# Patient Record
Sex: Male | Born: 1970 | Race: White | Hispanic: No | Marital: Single | Smoking: Current every day smoker
Health system: Southern US, Community
[De-identification: ages and names within clinical notes are randomized; demographics above are authoritative.]

## PROBLEM LIST (undated history)

## (undated) DIAGNOSIS — M1990 Unspecified osteoarthritis, unspecified site: Secondary | ICD-10-CM

## (undated) DIAGNOSIS — E785 Hyperlipidemia, unspecified: Secondary | ICD-10-CM

## (undated) DIAGNOSIS — F32A Depression, unspecified: Secondary | ICD-10-CM

## (undated) DIAGNOSIS — T7840XA Allergy, unspecified, initial encounter: Secondary | ICD-10-CM

## (undated) DIAGNOSIS — F329 Major depressive disorder, single episode, unspecified: Secondary | ICD-10-CM

## (undated) DIAGNOSIS — I10 Essential (primary) hypertension: Secondary | ICD-10-CM

## (undated) DIAGNOSIS — F419 Anxiety disorder, unspecified: Secondary | ICD-10-CM

## (undated) DIAGNOSIS — R569 Unspecified convulsions: Secondary | ICD-10-CM

## (undated) HISTORY — PX: KNEE SURGERY: SHX244

## (undated) HISTORY — PX: VASECTOMY: SHX75

## (undated) HISTORY — DX: Unspecified osteoarthritis, unspecified site: M19.90

## (undated) HISTORY — DX: Essential (primary) hypertension: I10

## (undated) HISTORY — DX: Major depressive disorder, single episode, unspecified: F32.9

## (undated) HISTORY — DX: Anxiety disorder, unspecified: F41.9

## (undated) HISTORY — PX: APPENDECTOMY: SHX54

## (undated) HISTORY — DX: Unspecified convulsions: R56.9

## (undated) HISTORY — DX: Allergy, unspecified, initial encounter: T78.40XA

## (undated) HISTORY — PX: COLONOSCOPY: SHX174

## (undated) HISTORY — DX: Hyperlipidemia, unspecified: E78.5

## (undated) HISTORY — DX: Depression, unspecified: F32.A

---

## 2010-07-16 ENCOUNTER — Ambulatory Visit (HOSPITAL_COMMUNITY): Admission: RE | Admit: 2010-07-16 | Discharge: 2010-07-16 | Payer: Self-pay | Admitting: Orthopedic Surgery

## 2012-03-24 IMAGING — CR DG ORBITS FOR FOREIGN BODY
2 series · 2 of 2 positions shown · non-contrast
Comparison: none

*RADIOLOGY  REPORT*
CLINICAL DATA: Metal working/exposure;  clearance prior to MRI

ORBITS FOR FOREIGN BODY - 2 VIEW

[w waters (1 of 2)]
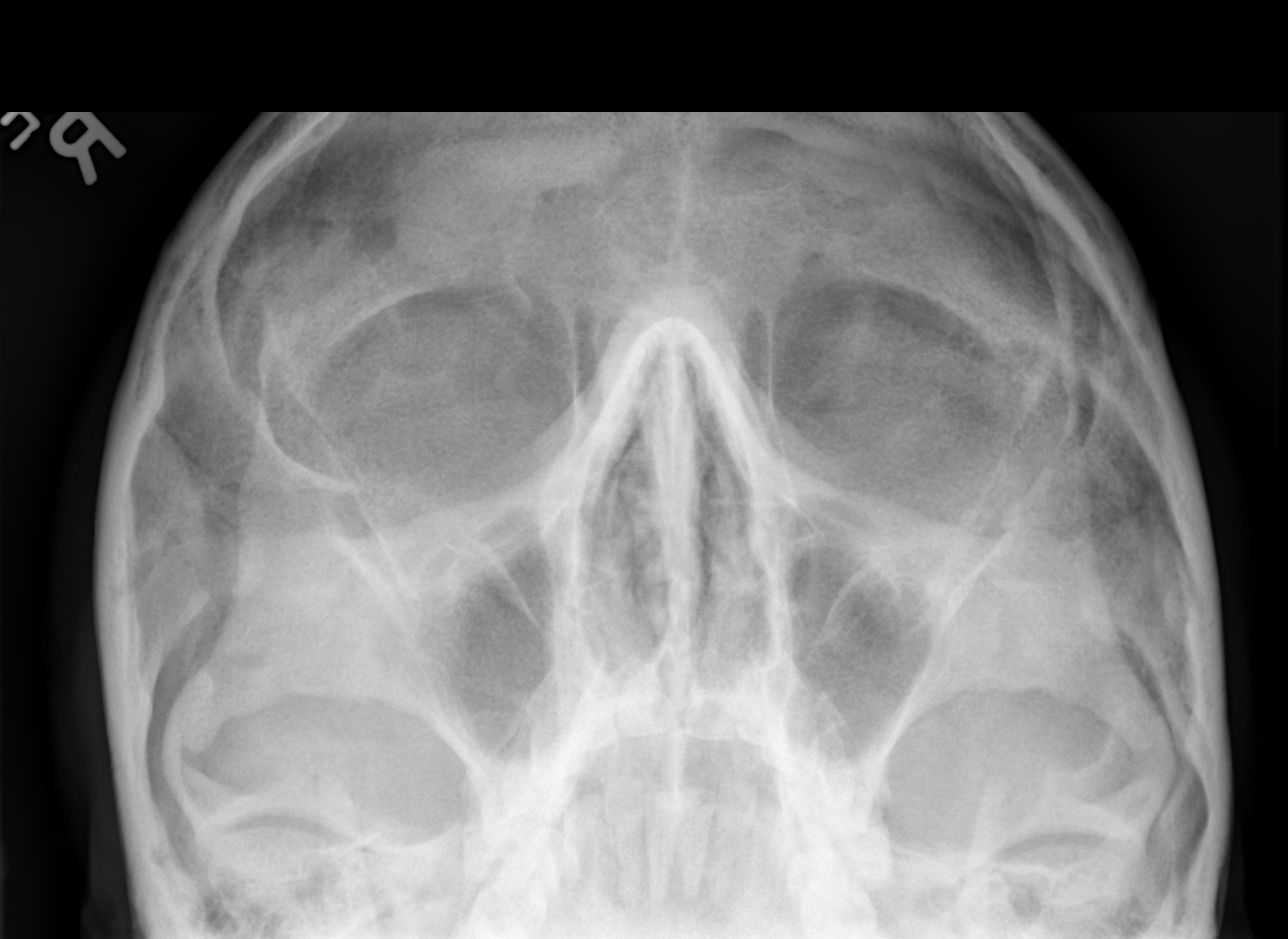

[w waters (2 of 2)]
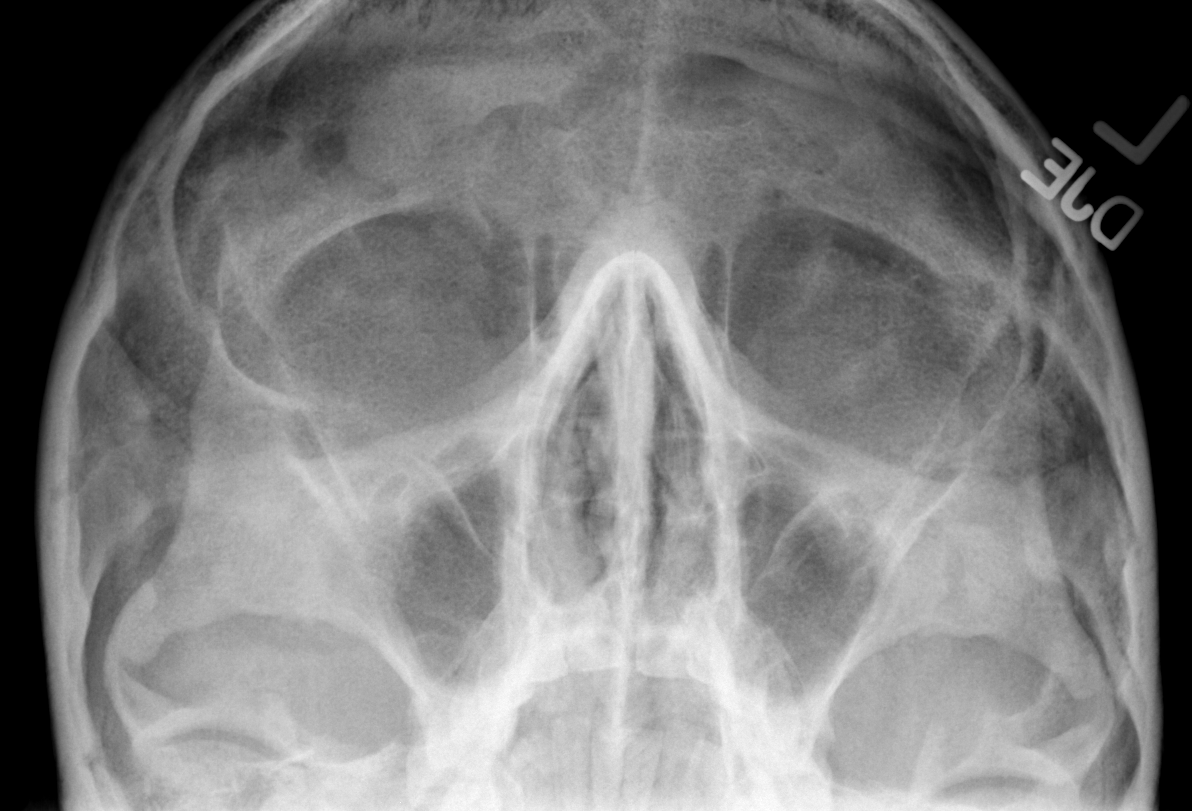

[2 of 2 positions shown; findings below may reference images not displayed]

FINDINGS: There is no evidence of metallic foreign body within the
orbits.  No significant bone abnormality identified.

IMPRESSION
No evidence of metallic foreign body within the orbits.

## 2015-09-06 ENCOUNTER — Ambulatory Visit (INDEPENDENT_AMBULATORY_CARE_PROVIDER_SITE_OTHER): Payer: BLUE CROSS/BLUE SHIELD | Admitting: Family Medicine

## 2015-09-06 ENCOUNTER — Encounter: Payer: Self-pay | Admitting: Family Medicine

## 2015-09-06 VITALS — BP 151/94 | HR 52 | Temp 97.3°F | Ht 70.5 in | Wt 184.6 lb

## 2015-09-06 DIAGNOSIS — Z720 Tobacco use: Secondary | ICD-10-CM | POA: Diagnosis not present

## 2015-09-06 DIAGNOSIS — F39 Unspecified mood [affective] disorder: Secondary | ICD-10-CM | POA: Diagnosis not present

## 2015-09-06 DIAGNOSIS — Z0000 Encounter for general adult medical examination without abnormal findings: Secondary | ICD-10-CM | POA: Diagnosis not present

## 2015-09-06 DIAGNOSIS — I10 Essential (primary) hypertension: Secondary | ICD-10-CM | POA: Diagnosis not present

## 2015-09-06 DIAGNOSIS — Z113 Encounter for screening for infections with a predominantly sexual mode of transmission: Secondary | ICD-10-CM | POA: Diagnosis not present

## 2015-09-06 DIAGNOSIS — N529 Male erectile dysfunction, unspecified: Secondary | ICD-10-CM

## 2015-09-06 MED ORDER — SERTRALINE HCL 50 MG PO TABS: 50.0000 mg | ORAL_TABLET | Freq: Every day | ORAL | Status: DC

## 2015-09-06 MED ORDER — LISINOPRIL 10 MG PO TABS: 10.0000 mg | ORAL_TABLET | Freq: Every day | ORAL | Status: DC

## 2015-09-06 NOTE — Patient Instructions (Addendum)
Great to meet you!  We have started 2 new medicines, celexa and lisinopril (for blood pressure)  Make a lab appt for tomorrow for fasting labs  Hypertension Hypertension, commonly called high blood pressure, is when the force of blood pumping through your arteries is too strong. Your arteries are the blood vessels that carry blood from your heart throughout your body. A blood pressure reading consists of a higher number over a lower number, such as 110/72. The higher number (systolic) is the pressure inside your arteries when your heart pumps. The lower number (diastolic) is the pressure inside your arteries when your heart relaxes. Ideally you want your blood pressure below 120/80. Hypertension forces your heart to work harder to pump blood. Your arteries may become narrow or stiff. Having hypertension puts you at risk for heart disease, stroke, and other problems.  RISK FACTORS Some risk factors for high blood pressure are controllable. Others are not.  Risk factors you cannot control include:   Race. You may be at higher risk if you are African American.  Age. Risk increases with age.  Gender. Men are at higher risk than women before age 58 years. After age 68, women are at higher risk than men. Risk factors you can control include:  Not getting enough exercise or physical activity.  Being overweight.  Getting too much fat, sugar, calories, or salt in your diet.  Drinking too much alcohol. SIGNS AND SYMPTOMS Hypertension does not usually cause signs or symptoms. Extremely high blood pressure (hypertensive crisis) may cause headache, anxiety, shortness of breath, and nosebleed. DIAGNOSIS  To check if you have hypertension, your health care provider will measure your blood pressure while you are seated, with your arm held at the level of your heart. It should be measured at least twice using the same arm. Certain conditions can cause a difference in blood pressure between your right  and left arms. A blood pressure reading that is higher than normal on one occasion does not mean that you need treatment. If one blood pressure reading is high, ask your health care provider about having it checked again. TREATMENT  Treating high blood pressure includes making lifestyle changes and possibly taking medicine. Living a healthy lifestyle can help lower high blood pressure. You may need to change some of your habits. Lifestyle changes may include:  Following the DASH diet. This diet is high in fruits, vegetables, and whole grains. It is low in salt, red meat, and added sugars.  Getting at least 2 hours of brisk physical activity every week.  Losing weight if necessary.  Not smoking.  Limiting alcoholic beverages.  Learning ways to reduce stress. If lifestyle changes are not enough to get your blood pressure under control, your health care provider may prescribe medicine. You may need to take more than one. Work closely with your health care provider to understand the risks and benefits. HOME CARE INSTRUCTIONS  Have your blood pressure rechecked as directed by your health care provider.   Take medicines only as directed by your health care provider. Follow the directions carefully. Blood pressure medicines must be taken as prescribed. The medicine does not work as well when you skip doses. Skipping doses also puts you at risk for problems.   Do not smoke.   Monitor your blood pressure at home as directed by your health care provider. SEEK MEDICAL CARE IF:   You think you are having a reaction to medicines taken.  You have recurrent headaches or feel  dizzy.  You have swelling in your ankles.  You have trouble with your vision. SEEK IMMEDIATE MEDICAL CARE IF:  You develop a severe headache or confusion.  You have unusual weakness, numbness, or feel faint.  You have severe chest or abdominal pain.  You vomit repeatedly.  You have trouble breathing. MAKE  SURE YOU:   Understand these instructions.  Will watch your condition.  Will get help right away if you are not doing well or get worse. Document Released: 12/01/2005 Document Revised: 04/17/2014 Document Reviewed: 09/23/2013 [facility] Patient Information 2015 [place], [place]. This information is not intended to replace advice given to you by your health care provider. Make sure you discuss any questions you have with your health care provider.

## 2015-09-06 NOTE — Progress Notes (Signed)
HPI  Patient presents today to establish care with a physical and to discuss a few medical problems  Overall he is in good health, he works as a Curator at [employer].  His past medical, surgical, family, and social history were reviewed and updated to relevant portions of EMR.   Mood disorder Patient feels that he is depressed and anxious at times. His symptoms are pretty significant and he would like to pursue trying out a medication for anxiety today. He denies any family history of bipolar disorder or ever being told he has bipolar disorder. He has anhedonia, feelings of depression, feeling like a failure, psychomotor slowing, easy irritability irritation, and persistent worrying. He states that he has had thoughts that he would be better off dead but denies any plan or attempt to hurt himself. He states that he would never hurt himself.   Hypertension No chest pain, dyspnea, palpitations, leg edema He states that he is checked his blood pressure several times in stores and that is always elevated, sometimes as high as 629U systolic. He is significant family history of hypertension and early MI in his mother at the age of 49 years old.   PMH: Smoking status noted ROS: Per HPI  Objective: BP 151/94 mmHg  Pulse 52  Temp(Src) 97.3 F (36.3 C) (Oral)  Ht 5' 10.5" (1.791 m)  Wt 184 lb 9.6 oz (83.734 kg)  BMI 26.10 kg/m2 Gen: NAD, alert, cooperative with exam HEENT: NCAT CV: RRR, good S1/S2, no murmur Resp: CTABL, no wheezes, non-labored Abd: SNTND, BS present, no guarding or organomegaly Ext: No edema, warm Neuro: Alert and oriented, 2 + patellar tendon reflexes  Mood screening tools: PHQ-9 score: 11 GAD-7 Score: 24   Assessment and plan:  # Healthcare maintenance Fasting labs, discussed smoking cessation  # Screening for sexual transmitted infection States he previously had a long-term relationship with his girlfriend who turns out was unfaithful to  him. He would like to be checked for common STI's.  # Hypertension No red flags, start lisinopril Check baseline labs, repeat in one month  # Mood disorder Seems to be predominantly anxiety, however some features of depression as well Start Zoloft, follow-up in 3-4 weeks Passive SI, he states that he would not has no plans of hurting himself  Orders Placed This Encounter  Procedures  . GC/Chlamydia Probe Amp  . CBC    Standing Status: Future     Number of Occurrences:      Standing Expiration Date: 09/05/2016  . CMP14+EGFR    Standing Status: Future     Number of Occurrences:      Standing Expiration Date: 09/05/2016  . HIV antibody    Standing Status: Future     Number of Occurrences:      Standing Expiration Date: 09/05/2016  . RPR    Standing Status: Future     Number of Occurrences:      Standing Expiration Date: 09/05/2016  . Lipid Panel    Standing Status: Future     Number of Occurrences:      Standing Expiration Date: 09/05/2016  . Ambulatory referral to Urology    Referral Priority:  Routine    Referral Type:  Consultation    Referral Reason:  Specialty Services Required    Requested Specialty:  Urology    Number of Visits Requested:  1    Meds ordered this encounter  Medications  . sertraline (ZOLOFT) 50 MG tablet    Sig: Take 1  tablet (50 mg total) by mouth daily.    Dispense:  30 tablet    Refill:  3  . lisinopril (PRINIVIL,ZESTRIL) 10 MG tablet    Sig: Take 1 tablet (10 mg total) by mouth daily.    Dispense:  30 tablet    Refill:  [place], MD [place] 09/06/2015, 3:32 PM

## 2015-09-06 NOTE — Addendum Note (Signed)
Addended by: Earlene Plater on: 09/06/2015 05:01 PM   Modules accepted: Orders

## 2015-09-07 LAB — HIV ANTIBODY (ROUTINE TESTING W REFLEX): HIV SCREEN 4TH GENERATION: NONREACTIVE

## 2015-09-07 LAB — CMP14+EGFR
A/G RATIO: 1.5 (ref 1.1–2.5)
ALBUMIN: 4.2 g/dL (ref 3.5–5.5)
ALK PHOS: 60 IU/L (ref 39–117)
ALT: 25 IU/L (ref 0–44)
AST: 20 IU/L (ref 0–40)
BILIRUBIN TOTAL: 0.4 mg/dL (ref 0.0–1.2)
BUN / CREAT RATIO: 15 (ref 9–20)
BUN: 14 mg/dL (ref 6–24)
CHLORIDE: 102 mmol/L (ref 97–108)
CO2: 25 mmol/L (ref 18–29)
Calcium: 9.5 mg/dL (ref 8.7–10.2)
Creatinine, Ser: 0.94 mg/dL (ref 0.76–1.27)
GFR calc non Af Amer: 99 mL/min/{1.73_m2} (ref >59)
GFR, EST AFRICAN AMERICAN: 114 mL/min/{1.73_m2} (ref >59)
GLOBULIN, TOTAL: 2.8 g/dL (ref 1.5–4.5)
GLUCOSE: 88 mg/dL (ref 65–99)
POTASSIUM: 4.1 mmol/L (ref 3.5–5.2)
SODIUM: 143 mmol/L (ref 134–144)
TOTAL PROTEIN: 7 g/dL (ref 6.0–8.5)

## 2015-09-07 LAB — CBC
HEMATOCRIT: 44.1 % (ref 37.5–51.0)
HEMOGLOBIN: 14.7 g/dL (ref 12.6–17.7)
MCH: 30.2 pg (ref 26.6–33.0)
MCHC: 33.3 g/dL (ref 31.5–35.7)
MCV: 91 fL (ref 79–97)
Platelets: 247 10*3/uL (ref 150–379)
RBC: 4.87 x10E6/uL (ref 4.14–5.80)
RDW: 13.9 % (ref 12.3–15.4)
WBC: 6.2 10*3/uL (ref 3.4–10.8)

## 2015-09-07 LAB — LIPID PANEL
CHOL/HDL RATIO: 5 ratio (ref 0.0–5.0)
Cholesterol, Total: 251 mg/dL — ABNORMAL HIGH (ref 100–199)
HDL: 50 mg/dL (ref >39)
LDL CALC: 156 mg/dL — AB (ref 0–99)
TRIGLYCERIDES: 223 mg/dL — AB (ref 0–149)
VLDL Cholesterol Cal: 45 mg/dL — ABNORMAL HIGH (ref 5–40)

## 2015-09-07 LAB — RPR: RPR Ser Ql: NONREACTIVE

## 2015-09-07 NOTE — Addendum Note (Signed)
Addended by: Timmothy Euler on: 09/07/2015 02:59 PM   Modules accepted: Miquel Dunn

## 2015-09-08 LAB — GC/CHLAMYDIA PROBE AMP
CHLAMYDIA, DNA PROBE: NEGATIVE
Neisseria gonorrhoeae by PCR: NEGATIVE

## 2015-10-04 ENCOUNTER — Encounter: Payer: Self-pay | Admitting: Family Medicine

## 2015-10-04 ENCOUNTER — Ambulatory Visit (INDEPENDENT_AMBULATORY_CARE_PROVIDER_SITE_OTHER): Payer: BLUE CROSS/BLUE SHIELD | Admitting: Family Medicine

## 2015-10-04 VITALS — BP 147/92 | HR 54 | Temp 97.0°F | Ht 70.5 in | Wt 179.6 lb

## 2015-10-04 DIAGNOSIS — M5442 Lumbago with sciatica, left side: Secondary | ICD-10-CM

## 2015-10-04 DIAGNOSIS — M545 Low back pain, unspecified: Secondary | ICD-10-CM | POA: Insufficient documentation

## 2015-10-04 DIAGNOSIS — E785 Hyperlipidemia, unspecified: Secondary | ICD-10-CM | POA: Diagnosis not present

## 2015-10-04 DIAGNOSIS — F39 Unspecified mood [affective] disorder: Secondary | ICD-10-CM

## 2015-10-04 DIAGNOSIS — I10 Essential (primary) hypertension: Secondary | ICD-10-CM

## 2015-10-04 MED ORDER — ATORVASTATIN CALCIUM 20 MG PO TABS: 20.0000 mg | ORAL_TABLET | Freq: Every day | ORAL | Status: DC

## 2015-10-04 MED ORDER — LISINOPRIL 20 MG PO TABS: 20.0000 mg | ORAL_TABLET | Freq: Every day | ORAL | Status: DC

## 2015-10-04 MED ORDER — DULOXETINE HCL 60 MG PO CPEP: 60.0000 mg | ORAL_CAPSULE | Freq: Every day | ORAL | Status: DC

## 2015-10-04 MED ORDER — NAPROXEN 500 MG PO TABS: 500.0000 mg | ORAL_TABLET | Freq: Two times a day (BID) | ORAL | Status: DC

## 2015-10-04 MED ORDER — CYCLOBENZAPRINE HCL 5 MG PO TABS: 5.0000 mg | ORAL_TABLET | Freq: Three times a day (TID) | ORAL | Status: DC | PRN

## 2015-10-04 NOTE — Patient Instructions (Signed)
Great to see you!  I have increased your blood pressure medicine,there is a new Rx at the pharmacy  I have also sent a pain medicine, naprosyn  I have also changed your mood medicine to duloxetine  Lets see you back in 2 months  You will get a call about the surgery referral within a week or so

## 2015-10-04 NOTE — Progress Notes (Signed)
   HPI  Patient presents today here for routine follow-up of blood pressure, hyperlipidemia, and low back pain.  Low back pain Has long history of left-sided low back pain with intermittent sciatica. Described as sharp intermittent to constant severe low back pain. Some days are good, some days are bad #Tylenol and iron over-the-counter NSAIDs with no improvement Has had epidural injections with some benefit. Easily.  Mood disorder Notes that his agitation has improved. No SI Tolerating Zoloft easily.  Hypertension No chest pain, dyspnea, palpitations, leg edema  Hyperlipidemia Not watching diet Still smoking  PMH: Smoking status noted ROS: Per HPI  Objective: BP 147/92 mmHg  Pulse 54  Temp(Src) 97 F (36.1 C) (Oral)  Ht 5' 10.5" (1.791 m)  Wt 179 lb 9.6 oz (81.466 kg)  BMI 25.40 kg/m2 Gen: NAD, alert, cooperative with exam HEENT: NCAT CV: RRR, good S1/S2, no murmur Resp: CTABL, no wheezes, non-labored Ext: No edema, warm Neuro: Alert and oriented, No gross deficits  Assessment and plan:  # Hyperlipidemia Has a 10.4 % ASCVD risk score, Start lipitor Encouraged stop smoking  # mood d/o Improving, change to cymbalta for pain help   # HTN Improving, increasing Lisinopril to 20 Re check BMP  # Low back pain with sciatica No flare currently NSDAIDs, flexeril, cymblata Refer for likely epidural steroid injections      Orders Placed This Encounter  Procedures  . Basic Metabolic Panel  . Ambulatory referral to Orthopedic Surgery    Referral Priority:  Routine    Referral Type:  Surgical    Referral Reason:  Specialty Services Required    Requested Specialty:  Orthopedic Surgery    Number of Visits Requested:  1    Meds ordered this encounter  Medications  . DULoxetine (CYMBALTA) 60 MG capsule    Sig: Take 1 capsule (60 mg total) by mouth daily.    Dispense:  30 capsule    Refill:  3  . lisinopril (PRINIVIL,ZESTRIL) 20 MG tablet    Sig: Take 1  tablet (20 mg total) by mouth daily.    Dispense:  30 tablet    Refill:  0  . cyclobenzaprine (FLEXERIL) 5 MG tablet    Sig: Take 1 tablet (5 mg total) by mouth 3 (three) times daily as needed for muscle spasms.    Dispense:  30 tablet    Refill:  1  . naproxen (NAPROSYN) 500 MG tablet    Sig: Take 1 tablet (500 mg total) by mouth 2 (two) times daily with a meal.    Dispense:  60 tablet    Refill:  1  . atorvastatin (LIPITOR) 20 MG tablet    Sig: Take 1 tablet (20 mg total) by mouth daily.    Dispense:  90 tablet    Refill:  [place], MD [facility] 10/04/2015, 8:28 AM

## 2015-10-05 LAB — BASIC METABOLIC PANEL
BUN/Creatinine Ratio: 14 (ref 9–20)
BUN: 13 mg/dL (ref 6–24)
CALCIUM: 9.5 mg/dL (ref 8.7–10.2)
CO2: 23 mmol/L (ref 18–29)
CREATININE: 0.95 mg/dL (ref 0.76–1.27)
Chloride: 100 mmol/L (ref 97–106)
GFR, EST AFRICAN AMERICAN: 113 mL/min/{1.73_m2} (ref >59)
GFR, EST NON AFRICAN AMERICAN: 98 mL/min/{1.73_m2} (ref >59)
Glucose: 85 mg/dL (ref 65–99)
Potassium: 4.4 mmol/L (ref 3.5–5.2)
Sodium: 140 mmol/L (ref 136–144)

## 2015-10-09 ENCOUNTER — Ambulatory Visit (INDEPENDENT_AMBULATORY_CARE_PROVIDER_SITE_OTHER): Payer: BLUE CROSS/BLUE SHIELD | Admitting: *Deleted

## 2015-10-09 DIAGNOSIS — Z23 Encounter for immunization: Secondary | ICD-10-CM

## 2015-10-19 ENCOUNTER — Telehealth: Payer: Self-pay | Admitting: Family Medicine

## 2015-10-19 NOTE — Telephone Encounter (Signed)
Faxed

## 2016-01-08 ENCOUNTER — Ambulatory Visit (INDEPENDENT_AMBULATORY_CARE_PROVIDER_SITE_OTHER): Payer: BLUE CROSS/BLUE SHIELD | Admitting: Family Medicine

## 2016-01-08 ENCOUNTER — Encounter: Payer: Self-pay | Admitting: Family Medicine

## 2016-01-08 VITALS — BP 116/79 | HR 73 | Temp 96.6°F | Ht 70.5 in | Wt 188.0 lb

## 2016-01-08 DIAGNOSIS — I10 Essential (primary) hypertension: Secondary | ICD-10-CM

## 2016-01-08 DIAGNOSIS — Z720 Tobacco use: Secondary | ICD-10-CM | POA: Diagnosis not present

## 2016-01-08 DIAGNOSIS — M5442 Lumbago with sciatica, left side: Secondary | ICD-10-CM | POA: Diagnosis not present

## 2016-01-08 DIAGNOSIS — E785 Hyperlipidemia, unspecified: Secondary | ICD-10-CM

## 2016-01-08 NOTE — Progress Notes (Signed)
   HPI  Patient presents today here follow-up for hypertension, hyperlipidemia, and back pain.  Hypertension Good medication compliance Exercising regularly, no cardio No chest pain, dyspnea, leg edema, headaches. He has had one or 2 episodes of palpitations, no associated diaphoresis, shortness of breath, or chest pain. He describes them as episodes of recognizing his heart is beating-no extra beats and no racing heart.  Hyperlipidemia Working out some Good med compliance  Back pain Improved with exercise He did not want to get the MRI or epidural injections recommended by orthopedics.   PMH: Smoking status noted ROS: Per HPI  Objective: BP 116/79 mmHg  Pulse 73  Temp(Src) 96.6 F (35.9 C) (Oral)  Ht 5' 10.5" (1.791 m)  Wt 188 lb (85.276 kg)  BMI 26.58 kg/m2 Gen: NAD, alert, cooperative with exam HEENT: NCAT CV: RRR, good S1/S2, no murmur Resp: CTABL, no wheezes, non-labored Ext: No edema, warm Neuro: Alert and oriented, No gross deficits  Assessment and plan:  # Hypertension Well controlled Continue lisinopril. Labs  # Lipidemia Recheck labs today Continue atorvastatin seems to have good compliance  # Back pain Improving with home exercises Continue Orthopedics for epidural injections if he desires   Orders Placed This Encounter  Procedures  . CMP14+EGFR  . Lipid Panel    Laroy Apple, MD [facility] 01/08/2016, 8:15 AM

## 2016-01-08 NOTE — Patient Instructions (Signed)
You are doing great! Good to see you!  Continue to cut back on cigarettes, it would be great for you to quit.   Try to work in some cardio, what you are doing now is good but cardio would add a lot of benefit for you from a health perspective.   Continue lisinopril, duloxetine, and atorvastatin.   Flexeril and naproxen are as needed for pain.   Come back in 6 months.

## 2016-01-09 LAB — CMP14+EGFR
ALBUMIN: 4 g/dL (ref 3.5–5.5)
ALT: 30 IU/L (ref 0–44)
AST: 30 IU/L (ref 0–40)
Albumin/Globulin Ratio: 1.5 (ref 1.1–2.5)
Alkaline Phosphatase: 60 IU/L (ref 39–117)
BUN / CREAT RATIO: 15 (ref 9–20)
BUN: 15 mg/dL (ref 6–24)
Bilirubin Total: 0.4 mg/dL (ref 0.0–1.2)
CALCIUM: 9 mg/dL (ref 8.7–10.2)
CHLORIDE: 103 mmol/L (ref 96–106)
CO2: 21 mmol/L (ref 18–29)
CREATININE: 1 mg/dL (ref 0.76–1.27)
GFR, EST AFRICAN AMERICAN: 106 mL/min/{1.73_m2} (ref >59)
GFR, EST NON AFRICAN AMERICAN: 92 mL/min/{1.73_m2} (ref >59)
GLUCOSE: 91 mg/dL (ref 65–99)
Globulin, Total: 2.7 g/dL (ref 1.5–4.5)
Potassium: 4.4 mmol/L (ref 3.5–5.2)
Sodium: 140 mmol/L (ref 134–144)
TOTAL PROTEIN: 6.7 g/dL (ref 6.0–8.5)

## 2016-01-09 LAB — LIPID PANEL
CHOL/HDL RATIO: 2.8 ratio (ref 0.0–5.0)
Cholesterol, Total: 141 mg/dL (ref 100–199)
HDL: 51 mg/dL (ref >39)
LDL CALC: 76 mg/dL (ref 0–99)
Triglycerides: 68 mg/dL (ref 0–149)
VLDL CHOLESTEROL CAL: 14 mg/dL (ref 5–40)

## 2016-01-30 ENCOUNTER — Other Ambulatory Visit: Payer: Self-pay

## 2016-01-30 DIAGNOSIS — I10 Essential (primary) hypertension: Secondary | ICD-10-CM

## 2016-01-30 DIAGNOSIS — F39 Unspecified mood [affective] disorder: Secondary | ICD-10-CM

## 2016-01-30 MED ORDER — DULOXETINE HCL 60 MG PO CPEP: 60.0000 mg | ORAL_CAPSULE | Freq: Every day | ORAL | Status: DC

## 2016-01-30 MED ORDER — LISINOPRIL 20 MG PO TABS: 20.0000 mg | ORAL_TABLET | Freq: Every day | ORAL | Status: DC

## 2016-07-08 ENCOUNTER — Ambulatory Visit (INDEPENDENT_AMBULATORY_CARE_PROVIDER_SITE_OTHER): Payer: BLUE CROSS/BLUE SHIELD | Admitting: Family Medicine

## 2016-07-08 ENCOUNTER — Encounter: Payer: Self-pay | Admitting: Family Medicine

## 2016-07-08 VITALS — BP 143/87 | HR 93 | Temp 97.4°F | Ht 70.5 in | Wt 190.0 lb

## 2016-07-08 DIAGNOSIS — F39 Unspecified mood [affective] disorder: Secondary | ICD-10-CM | POA: Diagnosis not present

## 2016-07-08 DIAGNOSIS — I10 Essential (primary) hypertension: Secondary | ICD-10-CM

## 2016-07-08 DIAGNOSIS — E785 Hyperlipidemia, unspecified: Secondary | ICD-10-CM

## 2016-07-08 LAB — CMP14+EGFR
ALBUMIN: 4.3 g/dL (ref 3.5–5.5)
ALK PHOS: 66 IU/L (ref 39–117)
ALT: 24 IU/L (ref 0–44)
AST: 19 IU/L (ref 0–40)
Albumin/Globulin Ratio: 1.7 (ref 1.2–2.2)
BUN / CREAT RATIO: 19 (ref 9–20)
BUN: 18 mg/dL (ref 6–24)
Bilirubin Total: 0.6 mg/dL (ref 0.0–1.2)
CALCIUM: 9.6 mg/dL (ref 8.7–10.2)
CO2: 22 mmol/L (ref 18–29)
CREATININE: 0.94 mg/dL (ref 0.76–1.27)
Chloride: 99 mmol/L (ref 96–106)
GFR, EST AFRICAN AMERICAN: 114 mL/min/{1.73_m2} (ref >59)
GFR, EST NON AFRICAN AMERICAN: 98 mL/min/{1.73_m2} (ref >59)
GLOBULIN, TOTAL: 2.6 g/dL (ref 1.5–4.5)
GLUCOSE: 94 mg/dL (ref 65–99)
Potassium: 4.3 mmol/L (ref 3.5–5.2)
SODIUM: 138 mmol/L (ref 134–144)
TOTAL PROTEIN: 6.9 g/dL (ref 6.0–8.5)

## 2016-07-08 LAB — LIPID PANEL
CHOLESTEROL TOTAL: 250 mg/dL — AB (ref 100–199)
Chol/HDL Ratio: 4.9 ratio units (ref 0.0–5.0)
HDL: 51 mg/dL (ref >39)
LDL Calculated: 167 mg/dL — ABNORMAL HIGH (ref 0–99)
Triglycerides: 160 mg/dL — ABNORMAL HIGH (ref 0–149)
VLDL Cholesterol Cal: 32 mg/dL (ref 5–40)

## 2016-07-08 MED ORDER — LISINOPRIL 20 MG PO TABS: 20.0000 mg | ORAL_TABLET | Freq: Every day | ORAL | 3 refills | Status: DC

## 2016-07-08 NOTE — Progress Notes (Signed)
   HPI  Patient presents today for follow-up.  Patient explains that he's been out of medications for about 4 months.  He had difficulty with insurance, cost, and then the pharmacy not stopping medications.  Hypertension No medications currently No chest pain, dyspnea, palpitations, leg edema. He's recreationally active but no formal exercise.  Mood disorder States that his Cymbalta withdrawal was very severe, he does not want to take his medications any longer. Because severe headache for 4 days. He denies suicidal thoughts. He states that overall his mood is very good and does not want to take medications any longer for this.  Hyperlipidemia He came to our office with Lipitor, currently not taking anything and not really watching his diet   PMH: Smoking status noted ROS: Per HPI  Objective: BP (!) 143/87   Pulse 93   Temp 97.4 F (36.3 C) (Oral)   Ht 5' 10.5" (1.791 m)   Wt 190 lb (86.2 kg)   BMI 26.88 kg/m  Gen: NAD, alert, cooperative with exam HEENT: NCAT CV: RRR, good S1/S2, no murmur Resp: CTABL, no wheezes, non-labored Ext: No edema, warm Neuro: Alert and oriented, No gross deficits  Assessment and plan:  # Hypertension Elevated today, not medicated Restart lisinopril Labs, repeat in 4-6 weeks  # Hyperlipidemia Previously very well controlled, since he is off of medications currently I will recheck and hold off on starting Lipitor until results are back  # Mood disorder Improved Patient does not want to take medications, he feels well currently, we will monitor closely.    Orders Placed This Encounter  Procedures  . Lipid panel  . CMP14+EGFR    Meds ordered this encounter  Medications  . lisinopril (PRINIVIL,ZESTRIL) 20 MG tablet    Sig: Take 1 tablet (20 mg total) by mouth daily.    Dispense:  90 tablet    Refill:  [place], MD [facility] 07/08/2016, 8:09 AM

## 2016-07-08 NOTE — Patient Instructions (Addendum)
Great to see you!  Lets see you again in 4-6 weeks to make sure the meds are helping and not affecting your labs.   We will hold off on cholesterol medicines for now to see if they are necessary

## 2016-07-09 ENCOUNTER — Other Ambulatory Visit: Payer: Self-pay | Admitting: Family Medicine

## 2016-07-09 MED ORDER — ATORVASTATIN CALCIUM 20 MG PO TABS: 20.0000 mg | ORAL_TABLET | Freq: Every day | ORAL | 3 refills | Status: DC

## 2016-08-05 ENCOUNTER — Encounter: Payer: Self-pay | Admitting: Family Medicine

## 2016-08-05 ENCOUNTER — Ambulatory Visit (INDEPENDENT_AMBULATORY_CARE_PROVIDER_SITE_OTHER): Payer: BLUE CROSS/BLUE SHIELD | Admitting: Family Medicine

## 2016-08-05 VITALS — BP 128/78 | HR 80 | Temp 97.2°F | Ht 70.5 in | Wt 187.8 lb

## 2016-08-05 DIAGNOSIS — Z720 Tobacco use: Secondary | ICD-10-CM

## 2016-08-05 DIAGNOSIS — E785 Hyperlipidemia, unspecified: Secondary | ICD-10-CM | POA: Diagnosis not present

## 2016-08-05 DIAGNOSIS — I10 Essential (primary) hypertension: Secondary | ICD-10-CM | POA: Diagnosis not present

## 2016-08-05 NOTE — Patient Instructions (Signed)
Great to see you!  Lets see you again n 4 months to follow up for hyperlipidemia

## 2016-08-05 NOTE — Progress Notes (Signed)
   HPI  Patient presents today here for follow-up of hypertension and hyperlipidemia.  Hypertension Good medication compliance, blood pressure has been better controlled at home. States 130s are normal systolic at home. No chest pain, dyspnea, palpitations, leg edema. Works out by [employer] on a daily basis, does not do much cardiac.   Hyperlipidemia Taking atorvastatin again, good compliance No myalgias or side effects Watching diet moderately.  Tobacco abuse Still smoking  PMH: Smoking status noted ROS: Per HPI  Objective: BP 128/78 (BP Location: Right Arm, Patient Position: Sitting, Cuff Size: Normal)   Pulse 80   Temp 97.2 F (36.2 C) (Oral)   Ht 5' 10.5" (1.791 m)   Wt 187 lb 12.8 oz (85.2 kg)   BMI 26.57 kg/m  Gen: NAD, alert, cooperative with exam HEENT: NCAT CV: RRR, good S1/S2, no murmur Resp: CTABL, no wheezes, non-labored Ext: No edema, warm Neuro: Alert and oriented, No gross deficits  Assessment and plan:  # Hypertension Well-controlled Continue lisinopril, repeat labs  # Hyperlipidemia Repeating labs today Has recently started Lipitor which she is tolerating well.   # Tobacco abuse Encouraged to quit smoking, he is not ready     Orders Placed This Encounter  Procedures  . CMP14+EGFR  . Lipid panel    Laroy Apple, MD [facility] 08/05/2016, 5:28 PM

## 2016-08-06 LAB — CMP14+EGFR
A/G RATIO: 1.6 (ref 1.2–2.2)
ALK PHOS: 63 IU/L (ref 39–117)
ALT: 34 IU/L (ref 0–44)
AST: 27 IU/L (ref 0–40)
Albumin: 4.4 g/dL (ref 3.5–5.5)
BILIRUBIN TOTAL: 0.6 mg/dL (ref 0.0–1.2)
BUN/Creatinine Ratio: 18 (ref 9–20)
BUN: 16 mg/dL (ref 6–24)
CHLORIDE: 102 mmol/L (ref 96–106)
CO2: 22 mmol/L (ref 18–29)
Calcium: 9.7 mg/dL (ref 8.7–10.2)
Creatinine, Ser: 0.9 mg/dL (ref 0.76–1.27)
GFR calc non Af Amer: 104 mL/min/{1.73_m2} (ref >59)
GFR, EST AFRICAN AMERICAN: 120 mL/min/{1.73_m2} (ref >59)
GLUCOSE: 92 mg/dL (ref 65–99)
Globulin, Total: 2.8 g/dL (ref 1.5–4.5)
POTASSIUM: 4.5 mmol/L (ref 3.5–5.2)
Sodium: 139 mmol/L (ref 134–144)
TOTAL PROTEIN: 7.2 g/dL (ref 6.0–8.5)

## 2016-08-06 LAB — LIPID PANEL
CHOLESTEROL TOTAL: 199 mg/dL (ref 100–199)
Chol/HDL Ratio: 4.6 ratio units (ref 0.0–5.0)
HDL: 43 mg/dL (ref >39)
LDL Calculated: 88 mg/dL (ref 0–99)
Triglycerides: 338 mg/dL — ABNORMAL HIGH (ref 0–149)
VLDL Cholesterol Cal: 68 mg/dL — ABNORMAL HIGH (ref 5–40)

## 2016-09-30 ENCOUNTER — Ambulatory Visit (INDEPENDENT_AMBULATORY_CARE_PROVIDER_SITE_OTHER): Payer: BLUE CROSS/BLUE SHIELD | Admitting: *Deleted

## 2016-09-30 DIAGNOSIS — Z23 Encounter for immunization: Secondary | ICD-10-CM

## 2016-12-05 ENCOUNTER — Ambulatory Visit (INDEPENDENT_AMBULATORY_CARE_PROVIDER_SITE_OTHER): Payer: BLUE CROSS/BLUE SHIELD | Admitting: Family Medicine

## 2016-12-05 ENCOUNTER — Encounter: Payer: Self-pay | Admitting: Family Medicine

## 2016-12-05 VITALS — BP 132/87 | HR 77 | Temp 96.6°F | Ht 70.5 in | Wt 200.8 lb

## 2016-12-05 DIAGNOSIS — E781 Pure hyperglyceridemia: Secondary | ICD-10-CM | POA: Diagnosis not present

## 2016-12-05 DIAGNOSIS — I10 Essential (primary) hypertension: Secondary | ICD-10-CM

## 2016-12-05 DIAGNOSIS — R200 Anesthesia of skin: Secondary | ICD-10-CM | POA: Diagnosis not present

## 2016-12-05 DIAGNOSIS — Z720 Tobacco use: Secondary | ICD-10-CM | POA: Diagnosis not present

## 2016-12-05 NOTE — Progress Notes (Signed)
   HPI  Patient presents today here for follow-up of chronic medical conditions.  Hypertension Good medication compliance, no chest pain Patient's working out daily.  He states that he's been bulking up quite a bit, he has started having arm numbness at home that it's positional. He wonders if this could be due to muscular enlargement.  Tobacco abuse Patient still not quite ready to quit, he has reduced from 1-1/2 packs per day to half a pack a day.  Hypertriglyceridemia Patient is watching his diet, however he does eat many carbohydrates.    PMH: Smoking status noted ROS: Per HPI  Objective: BP 132/87   Pulse 77   Temp (!) 96.6 F (35.9 C) (Oral)   Ht 5' 10.5" (1.791 m)   Wt 200 lb 12.8 oz (91.1 kg)   BMI 28.40 kg/m  Gen: NAD, alert, cooperative with exam HEENT: NCAT CV: RRR, good S1/S2, no murmur Muscular chest wall, 2+ Radial pulses BL with brisk cap refill Resp: CTABL, no wheezes, non-labored Abd: SNTND, BS present, no guarding or organomegaly Ext: No edema, warm Neuro: Alert and oriented, No gross deficits  Assessment and plan:  # Hypertension Well-controlled on lisinopril Continue, labs up-to-date  # Hypertriglyceridemia Discussed diet neck or size, patient is very active Plan on rechecking in about 6 months or at next follow-up in 4 months  # Tobacco abuse Recommend cessation, he is considering, he is cutting back  # Hand numbness Intermittent bilateral hand numbness. Pulses and brisk cap refill Reassurance provided, likely due to muscular chest wall and positional changes.   Gavin Apple, MD [facility] 12/05/2016, 4:32 PM

## 2017-03-06 ENCOUNTER — Encounter: Payer: Self-pay | Admitting: Family Medicine

## 2017-03-06 ENCOUNTER — Ambulatory Visit (INDEPENDENT_AMBULATORY_CARE_PROVIDER_SITE_OTHER): Payer: BLUE CROSS/BLUE SHIELD | Admitting: Family Medicine

## 2017-03-06 VITALS — BP 134/75 | HR 102 | Temp 97.1°F | Ht 70.5 in | Wt 188.6 lb

## 2017-03-06 DIAGNOSIS — E785 Hyperlipidemia, unspecified: Secondary | ICD-10-CM | POA: Diagnosis not present

## 2017-03-06 DIAGNOSIS — N529 Male erectile dysfunction, unspecified: Secondary | ICD-10-CM | POA: Diagnosis not present

## 2017-03-06 DIAGNOSIS — I10 Essential (primary) hypertension: Secondary | ICD-10-CM | POA: Diagnosis not present

## 2017-03-06 MED ORDER — SILDENAFIL CITRATE 20 MG PO TABS: ORAL_TABLET | ORAL | 5 refills | Status: DC

## 2017-03-06 NOTE — Patient Instructions (Signed)
Great to see you!  Come back in 4 to 6 months unless you need [place] sooner.

## 2017-03-06 NOTE — Progress Notes (Signed)
   HPI  Patient presents today here for follow up chronic medical conditions  ED, worsening. Difficulty maintaining erections.  Would like to try revatio  Hypertension Not checking blood pressures at home No chest pain, dyspnea, palpitations, leg edema. Good medication compliance.  Lipitor  Says good  PMH: Smoking status noted ROS: Per HPI  Objective: BP 134/75   Pulse (!) 102   Temp 97.1 F (36.2 C) (Oral)   Ht 5' 10.5" (1.791 m)   Wt 188 lb 9.6 oz (85.5 kg)   BMI 26.68 kg/m  Gen: NAD, alert, cooperative with exam HEENT: NCAT CV: RRR, good S1/S2, no murmur Resp: CTABL, no wheezes, non-labored Ext: No edema, warm Neuro: Alert and oriented, No gross deficits  Assessment and plan:  # Hypertension Well-controlled on current medications Continue lisinopril Labs are up-to-date, plan annual labs  # Hyperlipidemia Clinically stable Continue Lipitor Plan annual labs  # erectile dysfunction Revatio printed, RTC as needed  Meds ordered this encounter  Medications  . sildenafil (REVATIO) 20 MG tablet    Sig: Take 2 to 5 pills once daily as needed for erectile dysfunction    Dispense:  30 tablet    Refill:  [place], MD [facility] 03/06/2017, 4:46 PM

## 2017-05-17 ENCOUNTER — Other Ambulatory Visit: Payer: Self-pay | Admitting: Family Medicine

## 2017-05-17 DIAGNOSIS — I10 Essential (primary) hypertension: Secondary | ICD-10-CM

## 2017-06-23 ENCOUNTER — Ambulatory Visit (INDEPENDENT_AMBULATORY_CARE_PROVIDER_SITE_OTHER): Payer: BLUE CROSS/BLUE SHIELD

## 2017-06-23 ENCOUNTER — Ambulatory Visit (INDEPENDENT_AMBULATORY_CARE_PROVIDER_SITE_OTHER): Payer: BLUE CROSS/BLUE SHIELD | Admitting: Family

## 2017-06-23 ENCOUNTER — Encounter: Payer: Self-pay | Admitting: Family

## 2017-06-23 VITALS — BP 132/81 | HR 87 | Temp 97.8°F | Ht 70.5 in | Wt 188.0 lb

## 2017-06-23 DIAGNOSIS — M25561 Pain in right knee: Secondary | ICD-10-CM

## 2017-06-23 NOTE — Patient Instructions (Signed)
Anterior Cruciate Ligament Tear Ligaments are strong bands of tissue that connect bones to each other. The anterior cruciate ligament (ACL) connects your shin bone to your thigh bone. A tear in this ligament can cause pain and make it hard for you to put weight on your leg (use your leg to support your body weight). There are three types of ACL injuries:  Grade 1. In this type, the ligament is stretched.  Grade 2. In this type, the ligament is partially torn.  Grade 3. In this type, the ligament is completely torn.  What are the causes? This condition happens when too much pressure is put on the ACL. It may happen if:  You twist your knee, especially with your foot planted.  You make a quick change in direction (cut and pivot).  You slow down quickly while running.  You land a jump without bending your knee.  You forcefully straighten your knee more than normal (hyperextend your knee).  You are hit in the knee.  You hit your knee on the ground.  What increases the risk? This condition is more likely to develop in:  Women.  People who play sports that involve jumping or changing directions often. These include: ? Football. ? Basketball. ? Volleyball. ? Soccer. ? Skiing. ? Hockey. ? Gymnastics  What are the signs or symptoms? Symptoms of this condition include:  A popping sound at the time of injury.  A feeling that your knee is bending abnormally at the time of injury.  Pain.  Swelling.  Tenderness.  Instability when you try to put weight on your injured leg.  Inability to move your knee as far as normal.  Difficulty walking.  How is this diagnosed? This condition may be diagnosed based on:  Your symptoms.  Your medical history.  A physical exam.  Tests, such as: ? An X-ray. This may be done to check for bone injuries. ? MRI. This may be done to see if the tear is partial or complete and to check for additional injuries.  During your physical  exam, your provider will test your knee to see if it moves more than it should (laxity). How is this treated? This condition may be treated with:  Resting your knee.  Avoiding activities that cause pain, instability, or swelling.  Raising (elevating) your knee while resting.  Keeping weight off your leg until pain and swelling improve. You may need to use crutches or a walker.  Icing your knee.  Taking medicine to reduce pain and swelling.  Wearing a knee brace.  Doing range-of-motion, strengthening, and stretching exercises (physical therapy).  Surgery. This may be needed if you are very active and have a complete tear.  Follow these instructions at home: If you have a brace:  Wear it as told by your health care provider. Remove it only as told by your health care provider.  Loosen the brace if your toes tingle, become numb, or turn cold and blue.  Do not let your brace get wet if it is not waterproof.  Keep the brace clean. Managing pain, stiffness, and swelling  If directed, put ice on your knee: ? Put ice in a plastic bag. ? Place a towel between your skin and the bag. ? Leave the ice on for 20 minutes, 2-3 times a day.  Move your foot often to avoid stiffness and to lessen swelling.  Elevate your knee above the level of your heart while you are sitting or lying down. Driving    Do not drive or operate heavy machinery while taking prescription pain medicine.  Ask your health care provider when it is safe to drive if you have a brace on your leg. Activity  Return to your normal activities as told by your health care provider. Ask your health care provider what activities are safe for you.  Do exercises as told by your health care provider. General instructions  Do not use the injured limb to support your body weight until your health care provider says that you can. Use crutches or a walker as told by your health care provider.  Do not use any tobacco  products, such as cigarettes, chewing tobacco, and e-cigarettes. Tobacco can delay healing. If you need help quitting, ask your health care provider.  Take over-the-counter and prescription medicines only as told by your health care provider.  Keep all follow-up visits as told by your health care provider. This is important. How is this prevented?  Warm up and stretch before being active.  Cool down and stretch after being active.  Give your body time to rest between periods of activity.  Make sure to use equipment that fits you.  If you wear cleats, make sure they are appropriate for your playing surface.  Be safe and responsible while being active to avoid falls.  Maintain physical fitness, including: ? Strength. ? Flexibility. Contact a health care provider if:  Your symptoms are not improving.  Your symptoms are getting worse. This information is not intended to replace advice given to you by your health care provider. Make sure you discuss any questions you have with your health care provider. Document Released: 07/02/2005 Document Revised: 08/05/2016 Document Reviewed: 11/17/2015 Elsevier Interactive Patient Education  2017 Elsevier Inc.  

## 2017-06-23 NOTE — Progress Notes (Signed)
   Subjective:    Patient ID: Gavin Landry, male    DOB: 1971-09-26, 46 y.o.   MRN: [id number]  PT presents to the office today with right knee pain. Pt states the pain started a month ago, but today states he was getting off his lawn mower today and felt a "pop" and thought "my whole knee popped out and back into joint". Pt states he is now having constant aching pain in knee of 10 out 10. Pt states he has calf swelling.  Knee Pain   The pain is at a severity of 10/10. The pain is severe. The pain has been worsening since onset. Associated symptoms include an inability to bear weight and a loss of motion. The symptoms are aggravated by movement, weight bearing and palpation. The treatment provided no relief.      Review of Systems  Musculoskeletal:       Right knee pain   All other systems reviewed and are negative.      Objective:   Physical Exam  Constitutional: He is oriented to person, place, and time. He appears well-developed and well-nourished. No distress.  HENT:  Head: Normocephalic.  Cardiovascular: Normal rate, regular rhythm, normal heart sounds and intact distal pulses.   No murmur heard. Pulmonary/Chest: Effort normal and breath sounds normal. No respiratory distress. He has no wheezes.  Abdominal: Soft. Bowel sounds are normal. He exhibits no distension. There is no tenderness.  Musculoskeletal: He exhibits tenderness.  Pain with any flexion or extension of knee. Pain starting posteror of right knee radiating around to distal knee  Neurological: He is alert and oriented to person, place, and time.  Skin: Skin is warm and dry. No rash noted. No erythema.  Psychiatric: He has a normal mood and affect. His behavior is normal. Judgment and thought content normal.  Vitals reviewed.  X-ray- Negative Preliminary reading by Evelina Dun, FNP [facility]   BP 132/81 (BP Location: Right Arm, Patient Position: Sitting, Cuff Size: Normal)   Pulse 87   Temp 97.8 F (36.6 C)  (Oral)   Ht 5' 10.5" (1.791 m)   Wt 188 lb (85.3 kg)   BMI 26.59 kg/m      Assessment & Plan:  1. Acute pain of right knee - DG Knee 1-2 Views Right - Ambulatory referral to Orthopedic Surgery   We will do stat Ortho today, we will hold off on MRI today because pt is unsure if metal is in left knee Worrisome for tear Rest Stat ortho- Appt for tomorrow  Evelina Dun, FNP

## 2017-06-25 ENCOUNTER — Encounter (INDEPENDENT_AMBULATORY_CARE_PROVIDER_SITE_OTHER): Payer: Self-pay | Admitting: Orthopaedic Surgery

## 2017-06-25 ENCOUNTER — Ambulatory Visit (INDEPENDENT_AMBULATORY_CARE_PROVIDER_SITE_OTHER): Payer: BLUE CROSS/BLUE SHIELD | Admitting: Orthopaedic Surgery

## 2017-06-25 VITALS — BP 121/90 | HR 96 | Ht 70.0 in | Wt 191.0 lb

## 2017-06-25 DIAGNOSIS — M25561 Pain in right knee: Secondary | ICD-10-CM

## 2017-06-26 NOTE — Progress Notes (Signed)
Office Visit Note   Patient: Gavin Landry           Date of Birth: 08-31-1971           MRN: [id number] Visit Date: 06/25/2017              Requested by: Sharion Balloon, [address], [place] [address] PCP: Timmothy Euler, MD   Assessment & Plan: Visit Diagnoses:  1. Acute pain of right knee     Plan: Right knee injury with persistent mechanical symptoms, he is having difficulty walking. We'll proceed with the MRI scan right knee in office follow-up after scan.  Follow-Up Instructions: Follow-up office visit after MRI scan  Orders:  Orders Placed This Encounter  Procedures  . MR Knee Right w/o contrast   No orders of the defined types were placed in this encounter.     Procedures: No procedures performed   Clinical Data: No additional findings.   Subjective: Chief Complaint  Patient presents with  . Right Knee - Pain    HPI 46 year old male with some problems with his right knee for about a month states he was getting off the lawnmower and his knee popped with sharp pain he was able to move it some and then it popped back. He had significant swelling he states swelling is gone down and this occurred on 06/23/2017. He sees ibuprofen and icy hot, ice and elevation. He still been a mature with a significant limp. He points the medial joint line where he feels the pain. X-rays on 06/23/2017 reviewed which showed mild patellofemoral degenerative generator changes trace effusion but no acute fracture. Patient's continued to have medial joint line pain with weightbearing difficulty walking. He is not used a cane or crutches.  Review of Systems is systems positive for tobacco use history low back pain hyperlipidemia, hypertension, mood disorder, knee pain times greater than one month. Positive for depression, anxiety, acid reflux. Negative for drug dependency. Previous knee surgery was 46 years old. Otherwise -14 point as it pertains to his history of  present illness.   Objective: Vital Signs: BP 121/90   Pulse 96   Ht 5\' 10"  (1.778 m)   Wt 191 lb (86.6 kg)   BMI 27.41 kg/m   Physical Exam  Constitutional: He is oriented to person, place, and time. He appears well-developed and well-nourished.  HENT:  Head: Normocephalic and atraumatic.  Eyes: Pupils are equal, round, and reactive to light. EOM are normal.  Neck: No tracheal deviation present. No thyromegaly present.  Cardiovascular: Normal rate.   Pulmonary/Chest: Effort normal. He has no wheezes.  Abdominal: Soft. Bowel sounds are normal.  Musculoskeletal:  Patient has exquisite medial joint line tenderness. Negative Lachman and anterior drawer. Collateral ligaments are stable. No lateral joint line tenderness. Negative patellar subluxation. Pes bursa hamstrings are normal no Baker's cyst distal pulses are 2+. No rash or exposed skin. Negative straight leg raising 90.  Neurological: He is alert and oriented to person, place, and time.  Skin: Skin is warm and dry. Capillary refill takes less than 2 seconds.  Psychiatric: He has a normal mood and affect. His behavior is normal. Judgment and thought content normal.    Ortho Exam  Specialty Comments:  No specialty comments available.  Imaging: No results found.   PMFS History: Patient Active Problem List   Diagnosis Date Noted  . Low back pain 10/04/2015  . HLD (hyperlipidemia) 10/04/2015  . HTN (hypertension) 09/06/2015  .  Mood disorder ([place]) 09/06/2015  . Tobacco abuse 09/06/2015  . Screening for STD (sexually transmitted disease) 09/06/2015  . Erectile dysfunction 09/06/2015   Past Medical History:  Diagnosis Date  . Allergy   . Arthritis   . Depression   . Seizures ([place])     Family History  Problem Relation Age of Onset  . Depression Mother   . Diabetes Mother   . Heart disease Mother   . Hypertension Mother   . Heart attack Mother     Past Surgical History:  Procedure Laterality Date  .  APPENDECTOMY    . KNEE SURGERY    . VASECTOMY     Social History   Occupational History  . Not on file.   Social History Main Topics  . Smoking status: Current Every Day Smoker    Packs/day: 0.50    Types: Cigarettes  . Smokeless tobacco: Former Systems developer  . Alcohol use 4.2 oz/week    7 Cans of beer per week  . Drug use: No  . Sexual activity: Not on file

## 2017-07-09 ENCOUNTER — Ambulatory Visit (INDEPENDENT_AMBULATORY_CARE_PROVIDER_SITE_OTHER): Payer: BLUE CROSS/BLUE SHIELD | Admitting: Orthopaedic Surgery

## 2017-07-13 ENCOUNTER — Ambulatory Visit: Payer: BLUE CROSS/BLUE SHIELD | Admitting: Family Medicine

## 2017-08-16 ENCOUNTER — Other Ambulatory Visit: Payer: Self-pay | Admitting: Family Medicine

## 2017-11-16 ENCOUNTER — Other Ambulatory Visit: Payer: Self-pay | Admitting: Family Medicine

## 2017-11-16 DIAGNOSIS — I10 Essential (primary) hypertension: Secondary | ICD-10-CM

## 2017-11-17 ENCOUNTER — Other Ambulatory Visit: Payer: Self-pay | Admitting: Family Medicine

## 2017-11-18 ENCOUNTER — Other Ambulatory Visit: Payer: Self-pay | Admitting: Family Medicine

## 2017-11-18 NOTE — Telephone Encounter (Signed)
Last seen 06/23/17  [facility]

## 2017-11-19 ENCOUNTER — Telehealth: Payer: Self-pay | Admitting: Family Medicine

## 2017-11-19 NOTE — Telephone Encounter (Signed)
Pt notified he will need to be seen for further refill Pt will call back to schedule appt

## 2017-11-19 NOTE — Telephone Encounter (Signed)
Left detailed message.   

## 2017-12-13 ENCOUNTER — Other Ambulatory Visit: Payer: Self-pay | Admitting: Family Medicine

## 2018-02-06 ENCOUNTER — Other Ambulatory Visit: Payer: Self-pay | Admitting: Family Medicine

## 2018-02-08 NOTE — Telephone Encounter (Signed)
Last lipid 08/05/16

## 2018-02-08 NOTE — Telephone Encounter (Signed)
Patient aware.

## 2018-02-09 ENCOUNTER — Encounter: Payer: Self-pay | Admitting: Family Medicine

## 2018-02-09 ENCOUNTER — Ambulatory Visit: Payer: 59 | Admitting: Family Medicine

## 2018-02-09 VITALS — BP 171/92 | HR 109 | Temp 97.1°F | Ht 70.0 in | Wt 203.8 lb

## 2018-02-09 DIAGNOSIS — I10 Essential (primary) hypertension: Secondary | ICD-10-CM

## 2018-02-09 DIAGNOSIS — E785 Hyperlipidemia, unspecified: Secondary | ICD-10-CM

## 2018-02-09 DIAGNOSIS — R748 Abnormal levels of other serum enzymes: Secondary | ICD-10-CM

## 2018-02-09 MED ORDER — ATORVASTATIN CALCIUM 20 MG PO TABS: 20.0000 mg | ORAL_TABLET | Freq: Every day | ORAL | 3 refills | Status: DC

## 2018-02-09 MED ORDER — LISINOPRIL 20 MG PO TABS: 20.0000 mg | ORAL_TABLET | Freq: Every day | ORAL | 3 refills | Status: DC

## 2018-02-09 NOTE — Progress Notes (Signed)
   HPI  Patient presents today for follow-up chronic medical conditions.  Patient feels well and has no complaints. He tolerates medications well and has good medication compliance, he did not take lisinopril this morning.  He is watching his diet moderately. He does exercise regularly.  PMH: Smoking status noted ROS: Per HPI  Objective: BP (!) 171/92   Pulse (!) 109   Temp (!) 97.1 F (36.2 C) (Oral)   Ht '5\' 10"'$  (1.778 m)   Wt 203 lb 12.8 oz (92.4 kg)   BMI 29.24 kg/m  Gen: NAD, alert, cooperative with exam HEENT: NCAT CV: RRR, good S1/S2, no murmur Resp: CTABL, no wheezes, non-labored Ext: No edema, warm Neuro: Alert and oriented, No gross deficits  Assessment and plan:  #Hypertension Elevated, however patient has not taken medication today 2-3-week nurse blood pressure check Will return for fasting labs at that time as well  #Hyperlipidemia Repeat labs in 2-3 weeks at blood pressure check Refilled Lipitor    Orders Placed This Encounter  Procedures  . CMP14+EGFR  . CBC with Differential/Platelet  . Lipid panel  . TSH    Meds ordered this encounter  Medications  . lisinopril (PRINIVIL,ZESTRIL) 20 MG tablet    Sig: Take 1 tablet (20 mg total) by mouth daily.    Dispense:  90 tablet    Refill:  3  . atorvastatin (LIPITOR) 20 MG tablet    Sig: Take 1 tablet (20 mg total) by mouth daily.    Dispense:  90 tablet    Refill:  Bridg[place], MD [place] 02/09/2018, 9:35 AM

## 2018-02-09 NOTE — Patient Instructions (Signed)
Great to see you!  Come back in 2-3 weeks for a nurse blood pressure check and fasting labs.

## 2018-03-30 ENCOUNTER — Other Ambulatory Visit: Payer: 59

## 2018-03-30 DIAGNOSIS — I10 Essential (primary) hypertension: Secondary | ICD-10-CM | POA: Diagnosis not present

## 2018-03-30 DIAGNOSIS — E785 Hyperlipidemia, unspecified: Secondary | ICD-10-CM | POA: Diagnosis not present

## 2018-03-31 LAB — CBC WITH DIFFERENTIAL/PLATELET
Basophils Absolute: 0 10*3/uL (ref 0.0–0.2)
Basos: 0 %
EOS (ABSOLUTE): 0.3 10*3/uL (ref 0.0–0.4)
EOS: 3 %
HEMATOCRIT: 46.3 % (ref 37.5–51.0)
HEMOGLOBIN: 15.5 g/dL (ref 13.0–17.7)
Immature Grans (Abs): 0 10*3/uL (ref 0.0–0.1)
Immature Granulocytes: 0 %
Lymphocytes Absolute: 3.9 10*3/uL — ABNORMAL HIGH (ref 0.7–3.1)
Lymphs: 42 %
MCH: 30.7 pg (ref 26.6–33.0)
MCHC: 33.5 g/dL (ref 31.5–35.7)
MCV: 92 fL (ref 79–97)
MONOCYTES: 8 %
MONOS ABS: 0.8 10*3/uL (ref 0.1–0.9)
NEUTROS ABS: 4.3 10*3/uL (ref 1.4–7.0)
Neutrophils: 47 %
Platelets: 306 10*3/uL (ref 150–379)
RBC: 5.05 x10E6/uL (ref 4.14–5.80)
RDW: 13.8 % (ref 12.3–15.4)
WBC: 9.3 10*3/uL (ref 3.4–10.8)

## 2018-03-31 LAB — CMP14+EGFR
ALBUMIN: 4.7 g/dL (ref 3.5–5.5)
ALK PHOS: 75 IU/L (ref 39–117)
ALT: 48 IU/L — ABNORMAL HIGH (ref 0–44)
AST: 33 IU/L (ref 0–40)
Albumin/Globulin Ratio: 1.6 (ref 1.2–2.2)
BILIRUBIN TOTAL: 0.4 mg/dL (ref 0.0–1.2)
BUN / CREAT RATIO: 14 (ref 9–20)
BUN: 18 mg/dL (ref 6–24)
CHLORIDE: 98 mmol/L (ref 96–106)
CO2: 19 mmol/L — ABNORMAL LOW (ref 20–29)
CREATININE: 1.3 mg/dL — AB (ref 0.76–1.27)
Calcium: 9.9 mg/dL (ref 8.7–10.2)
GFR calc non Af Amer: 66 mL/min/{1.73_m2} (ref >59)
GFR, EST AFRICAN AMERICAN: 76 mL/min/{1.73_m2} (ref >59)
GLOBULIN, TOTAL: 2.9 g/dL (ref 1.5–4.5)
Glucose: 89 mg/dL (ref 65–99)
Potassium: 4.3 mmol/L (ref 3.5–5.2)
SODIUM: 138 mmol/L (ref 134–144)
TOTAL PROTEIN: 7.6 g/dL (ref 6.0–8.5)

## 2018-03-31 LAB — LIPID PANEL
Chol/HDL Ratio: 4.8 ratio (ref 0.0–5.0)
Cholesterol, Total: 210 mg/dL — ABNORMAL HIGH (ref 100–199)
HDL: 44 mg/dL (ref >39)
TRIGLYCERIDES: 462 mg/dL — AB (ref 0–149)

## 2018-03-31 LAB — TSH: TSH: 1.4 u[IU]/mL (ref 0.450–4.500)

## 2018-03-31 NOTE — Addendum Note (Signed)
Addended by: Nigel Berthold C on: 03/31/2018 01:29 PM   Modules accepted: Orders

## 2018-04-16 ENCOUNTER — Other Ambulatory Visit: Payer: 59

## 2018-04-16 DIAGNOSIS — R748 Abnormal levels of other serum enzymes: Secondary | ICD-10-CM

## 2018-04-17 LAB — CMP14+EGFR
ALT: 32 IU/L (ref 0–44)
AST: 23 IU/L (ref 0–40)
Albumin/Globulin Ratio: 1.7 (ref 1.2–2.2)
Albumin: 4.3 g/dL (ref 3.5–5.5)
Alkaline Phosphatase: 65 IU/L (ref 39–117)
BILIRUBIN TOTAL: 0.4 mg/dL (ref 0.0–1.2)
BUN/Creatinine Ratio: 17 (ref 9–20)
BUN: 15 mg/dL (ref 6–24)
CHLORIDE: 104 mmol/L (ref 96–106)
CO2: 21 mmol/L (ref 20–29)
Calcium: 9.4 mg/dL (ref 8.7–10.2)
Creatinine, Ser: 0.9 mg/dL (ref 0.76–1.27)
GFR calc Af Amer: 119 mL/min/{1.73_m2} (ref >59)
GFR calc non Af Amer: 103 mL/min/{1.73_m2} (ref >59)
GLUCOSE: 87 mg/dL (ref 65–99)
Globulin, Total: 2.5 g/dL (ref 1.5–4.5)
Potassium: 4.2 mmol/L (ref 3.5–5.2)
Sodium: 139 mmol/L (ref 134–144)
Total Protein: 6.8 g/dL (ref 6.0–8.5)

## 2019-02-23 ENCOUNTER — Other Ambulatory Visit: Payer: Self-pay | Admitting: Family Medicine

## 2019-02-23 DIAGNOSIS — I10 Essential (primary) hypertension: Secondary | ICD-10-CM

## 2019-03-02 IMAGING — DX DG KNEE 1-2V*R*
2 series · 2 of 2 positions shown · non-contrast
Comparison: None.

CLINICAL DATA: 46-year-old male developed right knee pain after
getting off lawnmower. Initial encounter.

EXAM:
RIGHT KNEE - 1-2 VIEW

[knee lat]
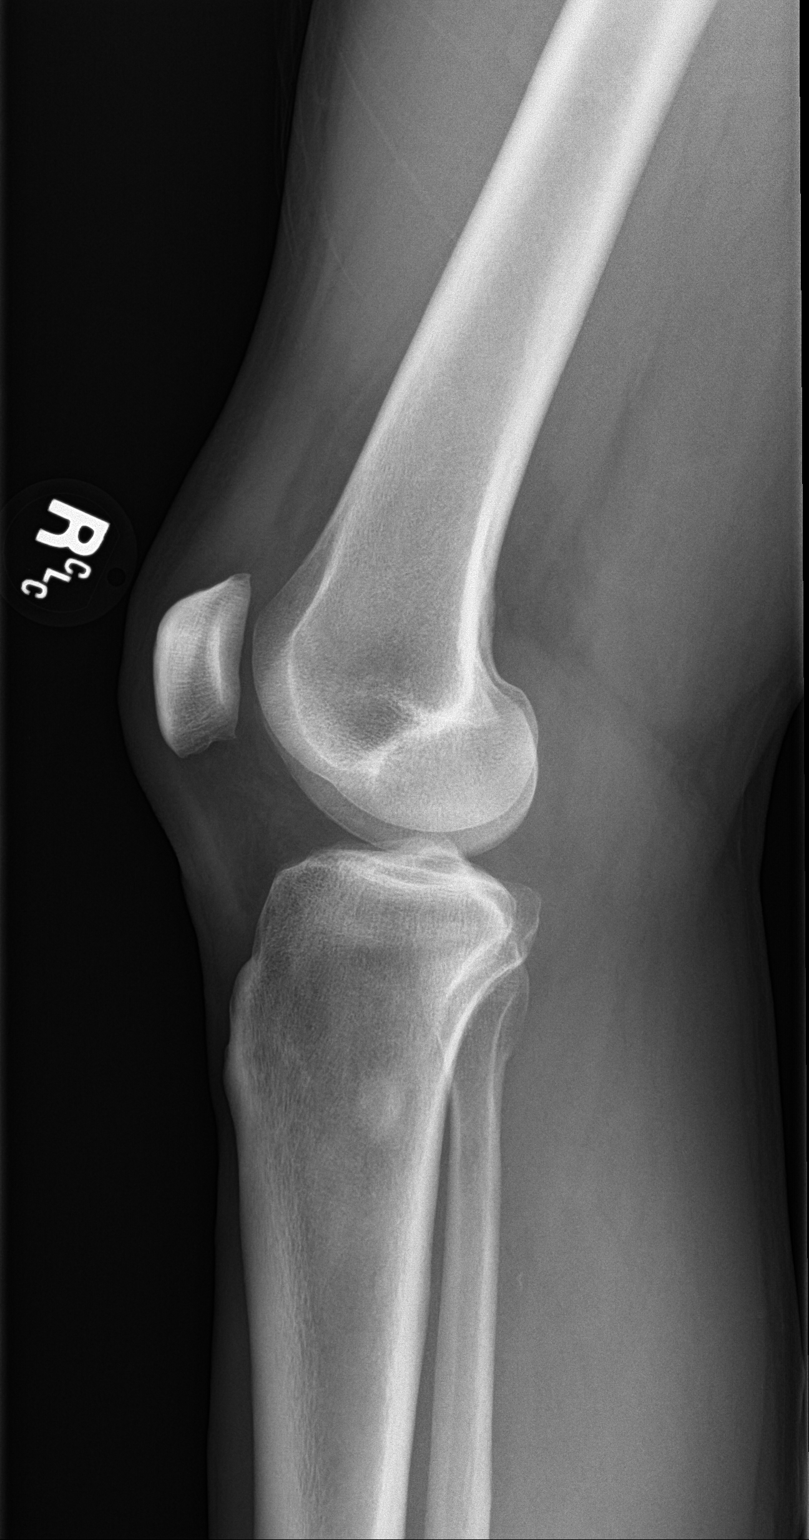

[knee ap]
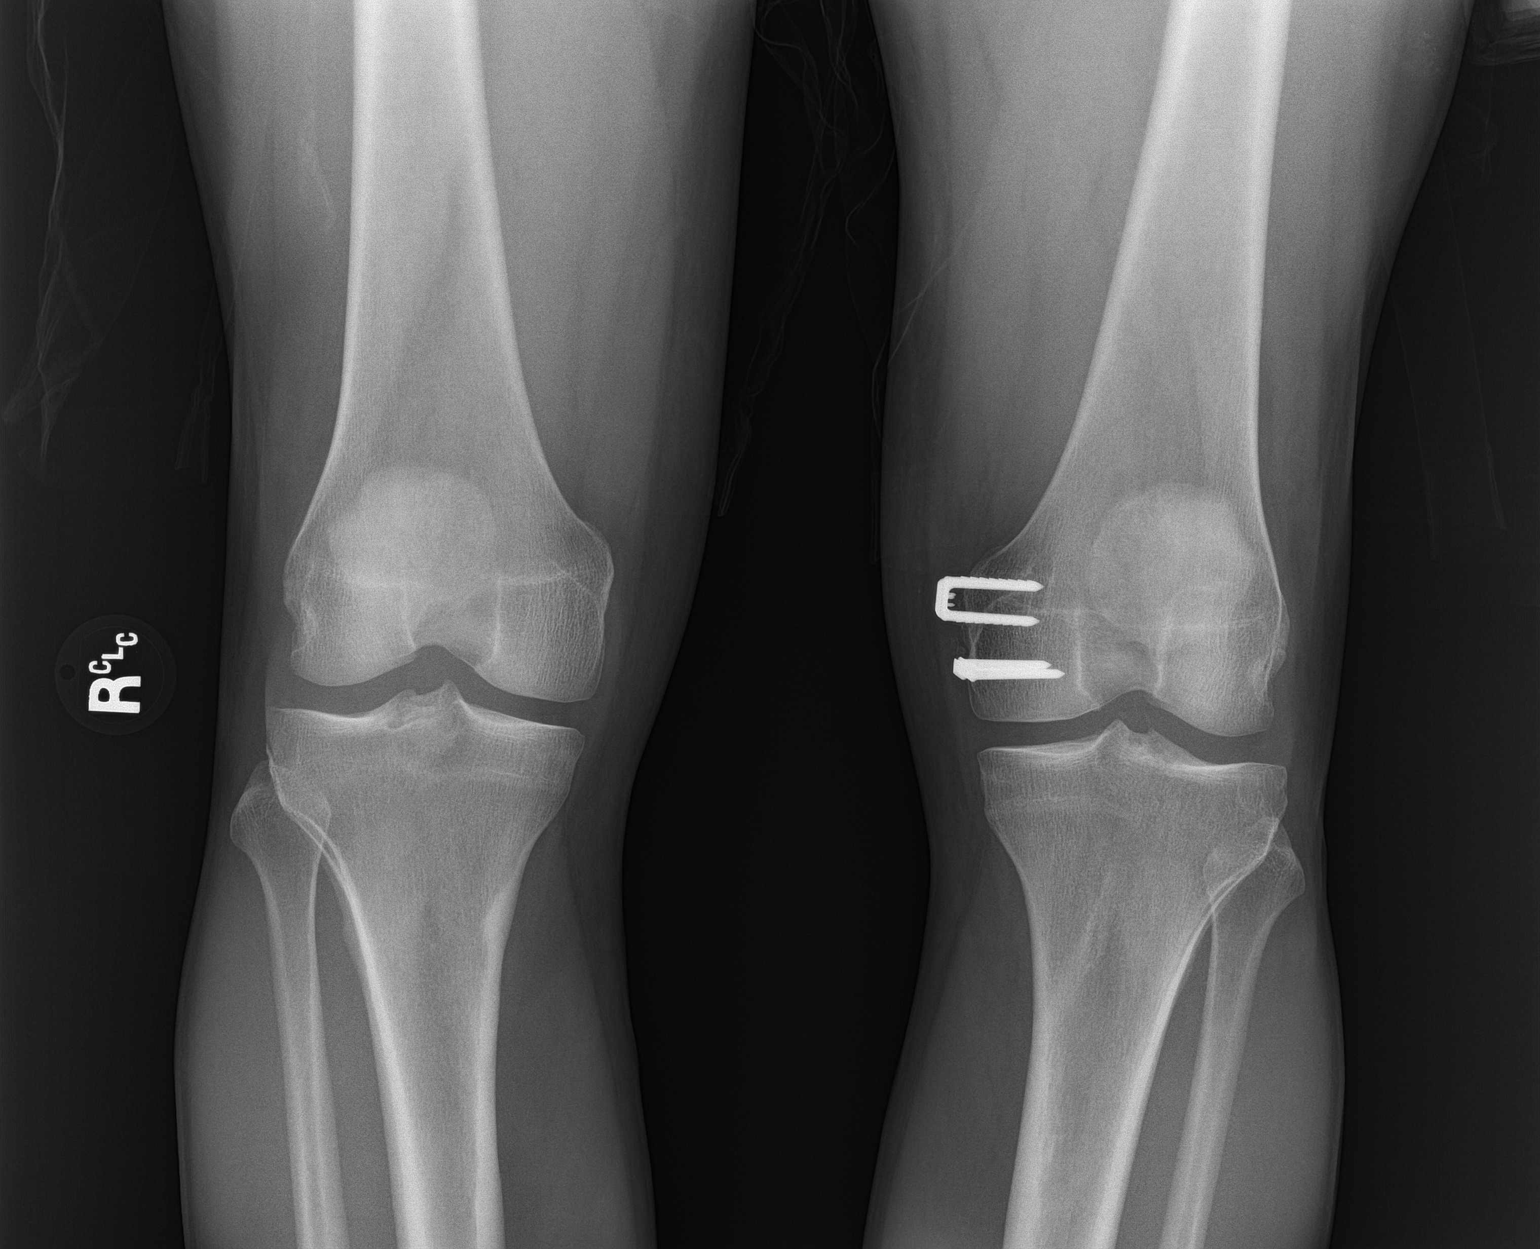

[2 of 2 positions shown; findings below may reference images not displayed]

FINDINGS: Two-view right knee:

No fracture or dislocation.

Minimal patellofemoral joint degenerative changes.

Tiny joint effusion suspected.

Small focal area of sclerosis medial aspect of the right tibial
metaphysis probably benign finding.

Single view left knee:

Postsurgical changes with staples in place within the medial left
femoral condyle.
IMPRESSION: No right knee fracture or dislocation.

Minimal right patellofemoral joint degenerative changes.

Tiny right joint effusion suspected.

Small focal area of sclerosis medial aspect of the right tibial
metaphysis probably benign incidental finding.

## 2019-04-20 ENCOUNTER — Ambulatory Visit (INDEPENDENT_AMBULATORY_CARE_PROVIDER_SITE_OTHER): Payer: 59 | Admitting: Family Medicine

## 2019-04-20 ENCOUNTER — Other Ambulatory Visit: Payer: Self-pay

## 2019-04-20 ENCOUNTER — Encounter: Payer: Self-pay | Admitting: Family Medicine

## 2019-04-20 DIAGNOSIS — E782 Mixed hyperlipidemia: Secondary | ICD-10-CM

## 2019-04-20 DIAGNOSIS — I10 Essential (primary) hypertension: Secondary | ICD-10-CM

## 2019-04-20 MED ORDER — ATORVASTATIN CALCIUM 20 MG PO TABS: 20.0000 mg | ORAL_TABLET | Freq: Every day | ORAL | 0 refills | Status: DC

## 2019-04-20 MED ORDER — LISINOPRIL 20 MG PO TABS: 20.0000 mg | ORAL_TABLET | Freq: Every day | ORAL | 0 refills | Status: DC

## 2019-04-20 NOTE — Progress Notes (Signed)
Virtual Visit via telephone Note Due to COVID-19, visit is conducted virtually and was requested by patient. This visit type was conducted due to national recommendations for restrictions regarding the COVID-19 Pandemic (e.g. social distancing) in an effort to limit this patient's exposure and mitigate transmission in our community. All issues noted in this document were discussed and addressed.  A physical exam was not performed with this format.   I connected with Ziggy Chanthavong on 04/20/19 at 0815 by telephone and verified that I am speaking with the correct person using two identifiers. Gavin Landry is currently located at home and family is currently with them during visit. The provider, Monia Pouch, FNP is located in their office at time of visit.  I discussed the limitations, risks, security and privacy concerns of performing an evaluation and management service by telephone and the availability of in person appointments. I also discussed with the patient that there may be a patient responsible charge related to this service. The patient expressed understanding and agreed to proceed.  Subjective:  Patient ID: Gavin Landry, male    DOB: October 26, 1971, 48 y.o.   MRN: [id number]  Chief Complaint:  Medical Management of Chronic Issues; Hypertension; and Hyperlipidemia   HPI: Gavin Landry is a 48 y.o. male presenting on 04/20/2019 for Medical Management of Chronic Issues; Hypertension; and Hyperlipidemia   Pt reports he is doing well overall. States he has been exercising on a regular basis. States he does try to watch his diet. He does not check his blood pressure at home. He is not taking his statin due to cost.   Hypertension  This is a chronic problem. The current episode started more than 1 year ago. The problem is unchanged. The problem is controlled. Pertinent negatives include no anxiety, blurred vision, chest pain, headaches, malaise/fatigue, neck pain, orthopnea, palpitations,  peripheral edema, PND, shortness of breath or sweats. There are no associated agents to hypertension. Risk factors for coronary artery disease include dyslipidemia and male gender. Past treatments include ACE inhibitors. The current treatment provides significant improvement. There are no compliance problems.  There is no history of angina, kidney disease, CAD/MI, CVA, heart failure, left ventricular hypertrophy, PVD or retinopathy.  Hyperlipidemia  This is a chronic problem. The current episode started more than 1 year ago. Pertinent negatives include no chest pain, focal sensory loss, focal weakness, leg pain, myalgias or shortness of breath. He is currently on no antihyperlipidemic treatment. Compliance problems include medication cost.      Relevant past medical, surgical, family, and social history reviewed and updated as indicated.  Allergies and medications reviewed and updated.   Past Medical History:  Diagnosis Date  . Allergy   . Arthritis   . Depression   . Seizures ([place])     Past Surgical History:  Procedure Laterality Date  . APPENDECTOMY    . KNEE SURGERY    . VASECTOMY      Social History   Socioeconomic History  . Marital status: Single    Spouse name: Not on file  . Number of children: Not on file  . Years of education: Not on file  . Highest education level: Not on file  Occupational History  . Not on file  Social Needs  . Financial resource strain: Not on file  . Food insecurity:    Worry: Not on file    Inability: Not on file  . Transportation needs:    Medical: Not on file    Non-medical: Not on  file  Tobacco Use  . Smoking status: Current Every Day Smoker    Packs/day: 0.50    Types: Cigarettes  . Smokeless tobacco: Former Network engineer and Sexual Activity  . Alcohol use: Yes    Alcohol/week: 7.0 standard drinks    Types: 7 Cans of beer per week  . Drug use: No  . Sexual activity: Not on file  Lifestyle  . Physical activity:    Days per  week: Not on file    Minutes per session: Not on file  . Stress: Not on file  Relationships  . Social connections:    Talks on phone: Not on file    Gets together: Not on file    Attends religious service: Not on file    Active member of club or organization: Not on file    Attends meetings of clubs or organizations: Not on file    Relationship status: Not on file  . Intimate partner violence:    Fear of current or ex partner: Not on file    Emotionally abused: Not on file    Physically abused: Not on file    Forced sexual activity: Not on file  Other Topics Concern  . Not on file  Social History Narrative  . Not on file    Outpatient Encounter Medications as of 04/20/2019  Medication Sig  . atorvastatin (LIPITOR) 20 MG tablet Take 1 tablet (20 mg total) by mouth daily.  Marland Kitchen lisinopril (ZESTRIL) 20 MG tablet Take 1 tablet (20 mg total) by mouth daily.  . [DISCONTINUED] atorvastatin (LIPITOR) 20 MG tablet Take 1 tablet (20 mg total) by mouth daily.  . [DISCONTINUED] lisinopril (PRINIVIL,ZESTRIL) 20 MG tablet Take 1 tablet (20 mg total) by mouth daily.   No facility-administered encounter medications on file as of 04/20/2019.     Allergies  Allergen Reactions  . Iodine   . Other     IV contrast    Review of Systems  Constitutional: Negative for activity change, appetite change, chills, diaphoresis, fatigue, fever, malaise/fatigue and unexpected weight change.  Eyes: Negative for blurred vision, photophobia and visual disturbance.  Respiratory: Negative for cough, chest tightness, shortness of breath and wheezing.   Cardiovascular: Negative for chest pain, palpitations, orthopnea, leg swelling and PND.  Musculoskeletal: Negative for myalgias and neck pain.  Neurological: Negative for dizziness, tremors, focal weakness, seizures, syncope, facial asymmetry, speech difficulty, weakness, light-headedness, numbness and headaches.  Psychiatric/Behavioral: Negative for confusion.  All  other systems reviewed and are negative.        Observations/Objective: No vital signs or physical exam, this was a telephone or virtual health encounter.  Pt alert and oriented, answers all questions appropriately, and able to speak in full sentences.    Assessment and Plan: Diagnoses and all orders for this visit:  Essential hypertension Diet and exercise encouraged. Continue medications as prescribed. Follow up in 3 months for labs and reevaluation. Report any persistent high or low readings.  -     lisinopril (ZESTRIL) 20 MG tablet; Take 1 tablet (20 mg total) by mouth daily.  Mixed hyperlipidemia Diet and exercise encouraged. Pt states he is not taking the Lipitor due to cost. Wants to recheck cholesterol before starting again. Will check labs at next appointment.  -     atorvastatin (LIPITOR) 20 MG tablet; Take 1 tablet (20 mg total) by mouth daily.     Follow Up Instructions: Return in about 3 months (around 07/21/2019), or if symptoms worsen or fail  to improve, for HTN, Lipids.    I discussed the assessment and treatment plan with the patient. The patient was provided an opportunity to ask questions and all were answered. The patient agreed with the plan and demonstrated an understanding of the instructions.   The patient was advised to call back or seek an in-person evaluation if the symptoms worsen or if the condition fails to improve as anticipated.  The above assessment and management plan was discussed with the patient. The patient verbalized understanding of and has agreed to the management plan. Patient is aware to call the clinic if symptoms persist or worsen. Patient is aware when to return to the clinic for a follow-up visit. Patient educated on when it is appropriate to go to the emergency department.    I provided 25 minutes of non-face-to-face time during this encounter. The call started at 0815. The call ended at 0830. The other time was used for coordination  of care.    Monia Pouch, FNP-C [facility] [address] [place], [place] [address] [phone]

## 2019-07-21 ENCOUNTER — Encounter: Payer: Self-pay | Admitting: Family Medicine

## 2019-07-21 ENCOUNTER — Other Ambulatory Visit: Payer: Self-pay

## 2019-07-21 ENCOUNTER — Ambulatory Visit (INDEPENDENT_AMBULATORY_CARE_PROVIDER_SITE_OTHER): Payer: Self-pay | Admitting: Family Medicine

## 2019-07-21 VITALS — BP 114/71 | HR 84 | Temp 99.3°F | Ht 70.0 in | Wt 202.0 lb

## 2019-07-21 DIAGNOSIS — I10 Essential (primary) hypertension: Secondary | ICD-10-CM

## 2019-07-21 DIAGNOSIS — Z720 Tobacco use: Secondary | ICD-10-CM

## 2019-07-21 DIAGNOSIS — E782 Mixed hyperlipidemia: Secondary | ICD-10-CM

## 2019-07-21 MED ORDER — ATORVASTATIN CALCIUM 20 MG PO TABS: 20.0000 mg | ORAL_TABLET | Freq: Every day | ORAL | 3 refills | Status: DC

## 2019-07-21 MED ORDER — LISINOPRIL 20 MG PO TABS: 20.0000 mg | ORAL_TABLET | Freq: Every day | ORAL | 3 refills | Status: DC

## 2019-07-21 NOTE — Patient Instructions (Signed)
DASH Eating Plan DASH stands for "Dietary Approaches to Stop Hypertension." The DASH eating plan is a healthy eating plan that has been shown to reduce high blood pressure (hypertension). Additional health benefits may include reducing the risk of type 2 diabetes mellitus, heart disease, and stroke. The DASH eating plan may also help with weight loss.  WHAT DO I NEED TO KNOW ABOUT THE DASH EATING PLAN? For the DASH eating plan, you will follow these general guidelines:  Choose foods with a percent daily value for sodium of less than 5% (as listed on the food label).  Use salt-free seasonings or herbs instead of table salt or sea salt.  Check with your health care provider or pharmacist before using salt substitutes.  Eat lower-sodium products, often labeled as "lower sodium" or "no salt added."  Eat fresh foods.  Eat more vegetables, fruits, and low-fat dairy products.  Choose whole grains. Look for the word "whole" as the first word in the ingredient list.  Choose fish and skinless chicken or turkey more often than red meat. Limit fish, poultry, and meat to 6 oz (170 g) each day.  Limit sweets, desserts, sugars, and sugary drinks.  Choose heart-healthy fats.  Limit cheese to 1 oz (28 g) per day.  Eat more home-cooked food and less restaurant, buffet, and fast food.  Limit fried foods.  Cook foods using methods other than frying.  Limit canned vegetables. If you do use them, rinse them well to decrease the sodium.  When eating at a restaurant, ask that your food be prepared with less salt, or no salt if possible.  WHAT FOODS CAN I EAT? Seek help from a dietitian for individual calorie needs.  Grains Whole grain or whole wheat bread. Brown rice. Whole grain or whole wheat pasta. Quinoa, bulgur, and whole grain cereals. Low-sodium cereals. Corn or whole wheat flour tortillas. Whole grain cornbread. Whole grain crackers. Low-sodium crackers.  Vegetables Fresh or frozen  vegetables (raw, steamed, roasted, or grilled). Low-sodium or reduced-sodium tomato and vegetable juices. Low-sodium or reduced-sodium tomato sauce and paste. Low-sodium or reduced-sodium canned vegetables.   Fruits All fresh, canned (in natural juice), or frozen fruits.  Meat and Other Protein Products Ground beef (85% or leaner), grass-fed beef, or beef trimmed of fat. Skinless chicken or turkey. Ground chicken or turkey. Pork trimmed of fat. All fish and seafood. Eggs. Dried beans, peas, or lentils. Unsalted nuts and seeds. Unsalted canned beans.  Dairy Low-fat dairy products, such as skim or 1% milk, 2% or reduced-fat cheeses, low-fat ricotta or cottage cheese, or plain low-fat yogurt. Low-sodium or reduced-sodium cheeses.  Fats and Oils Tub margarines without trans fats. Light or reduced-fat mayonnaise and salad dressings (reduced sodium). Avocado. Safflower, olive, or canola oils. Natural peanut or almond butter.  Other Unsalted popcorn and pretzels. The items listed above may not be a complete list of recommended foods or beverages. Contact your dietitian for more options.  WHAT FOODS ARE NOT RECOMMENDED?  Grains White bread. White pasta. White rice. Refined cornbread. Bagels and croissants. Crackers that contain trans fat.  Vegetables Creamed or fried vegetables. Vegetables in a cheese sauce. Regular canned vegetables. Regular canned tomato sauce and paste. Regular tomato and vegetable juices.  Fruits Dried fruits. Canned fruit in light or heavy syrup. Fruit juice.  Meat and Other Protein Products Fatty cuts of meat. Ribs, chicken wings, bacon, sausage, bologna, salami, chitterlings, fatback, hot dogs, bratwurst, and packaged luncheon meats. Salted nuts and seeds. Canned beans with salt.    Dairy Whole or 2% milk, cream, half-and-half, and cream cheese. Whole-fat or sweetened yogurt. Full-fat cheeses or blue cheese. Nondairy creamers and whipped toppings. Processed cheese,  cheese spreads, or cheese curds.  Condiments Onion and garlic salt, seasoned salt, table salt, and sea salt. Canned and packaged gravies. Worcestershire sauce. Tartar sauce. Barbecue sauce. Teriyaki sauce. Soy sauce, including reduced sodium. Steak sauce. Fish sauce. Oyster sauce. Cocktail sauce. Horseradish. Ketchup and mustard. Meat flavorings and tenderizers. Bouillon cubes. Hot sauce. Tabasco sauce. Marinades. Taco seasonings. Relishes.  Fats and Oils Butter, stick margarine, lard, shortening, ghee, and bacon fat. Coconut, palm kernel, or palm oils. Regular salad dressings.  Other Pickles and olives. Salted popcorn and pretzels.  The items listed above may not be a complete list of foods and beverages to avoid. Contact your dietitian for more information.  WHERE CAN I FIND MORE INFORMATION? National [employer], [employer], [employer]: www.nhlbi.nih.gov/health/health-topics/topics/dash/ Document Released: 11/20/2011 Document Revised: 04/17/2014 Document Reviewed: 10/05/2013 Exit[employer] Patient Information 2015 [employer]. This information is not intended to replace advice given to you by your health care provider. Make sure you discuss any questions you have with your health care provider.   I think that you would greatly benefit from seeing a nutritionist.  If you are interested, please call Dr Sykes at [phone] to schedule an appointment.   

## 2019-07-21 NOTE — Progress Notes (Signed)
Subjective:  Patient ID: Gavin Landry, male    DOB: 11/24/1971, 49 y.o.   MRN: [id number]  Patient Care Team: Baruch Gouty, FNP as PCP - General (Family Medicine)   Chief Complaint:  Medical Management of Chronic Issues, Hypertension, and Hyperlipidemia   HPI: Gavin Landry is a 47 y.o. male presenting on 07/21/2019 for Medical Management of Chronic Issues, Hypertension, and Hyperlipidemia   1. Essential hypertension  Complaint with meds - Yes Current Medications - lisinopril 20 mg  Checking BP at home ranging 120/70 Exercising Regularly - Yes Watching Salt intake - Yes Pertinent ROS:  Headache - No Fatigue - No Visual Disturbances - No Chest pain - No Dyspnea - No Palpitations - No LE edema - No They report good compliance with medications and can restate their regimen by memory. No medication side effects.  Family, social, and smoking history reviewed.   BP Readings from Last 3 Encounters:  07/21/19 114/71  02/09/18 (!) 171/92  06/25/17 121/90   CMP Latest Ref Rng & Units 04/16/2018 03/30/2018 08/05/2016  Glucose 65 - 99 mg/dL 87 89 92  BUN 6 - 24 mg/dL 15 18 16   Creatinine 0.76 - 1.27 mg/dL 0.90 1.30(H) 0.90  Sodium 134 - 144 mmol/L 139 138 139  Potassium 3.5 - 5.2 mmol/L 4.2 4.3 4.5  Chloride 96 - 106 mmol/L 104 98 102  CO2 20 - 29 mmol/L 21 19(L) 22  Calcium 8.7 - 10.2 mg/dL 9.4 9.9 9.7  Total Protein 6.0 - 8.5 g/dL 6.8 7.6 7.2  Total Bilirubin 0.0 - 1.2 mg/dL 0.4 0.4 0.6  Alkaline Phos 39 - 117 IU/L 65 75 63  AST 0 - 40 IU/L 23 33 27  ALT 0 - 44 IU/L 32 48(H) 34      2. Mixed hyperlipidemia  Compliant with medications - No Current medications - was taking atorvastatin but stopped due to cost, lost insurance Side effects from medications - No Diet - does try to watch diet Exercise - very active on a daily basis.   Lab Results  Component Value Date   CHOL 210 (H) 03/30/2018   HDL 44 03/30/2018   LDLCALC Comment 03/30/2018   TRIG 462 (H) 03/30/2018    CHOLHDL 4.8 03/30/2018     Family and personal medical history reviewed. Smoking and ETOH history reviewed.    3. Tobacco abuse  Has cut back to 10 cigarettes per day from 2 PPD. Continues to work on cutting back.      Relevant past medical, surgical, family, and social history reviewed and updated as indicated.  Allergies and medications reviewed and updated. Date reviewed: Chart in Epic.   Past Medical History:  Diagnosis Date  . Allergy   . Arthritis   . Depression   . Seizures ([place])     Past Surgical History:  Procedure Laterality Date  . APPENDECTOMY    . KNEE SURGERY    . VASECTOMY      Social History   Socioeconomic History  . Marital status: Single    Spouse name: Not on file  . Number of children: Not on file  . Years of education: Not on file  . Highest education level: Not on file  Occupational History  . Not on file  Social Needs  . Financial resource strain: Not on file  . Food insecurity    Worry: Not on file    Inability: Not on file  . Transportation needs    Medical: Not on  file    Non-medical: Not on file  Tobacco Use  . Smoking status: Current Every Day Smoker    Packs/day: 0.50    Types: Cigarettes  . Smokeless tobacco: Former Network engineer and Sexual Activity  . Alcohol use: Yes    Alcohol/week: 7.0 standard drinks    Types: 7 Cans of beer per week  . Drug use: No  . Sexual activity: Not on file  Lifestyle  . Physical activity    Days per week: Not on file    Minutes per session: Not on file  . Stress: Not on file  Relationships  . Social Herbalist on phone: Not on file    Gets together: Not on file    Attends religious service: Not on file    Active member of club or organization: Not on file    Attends meetings of clubs or organizations: Not on file    Relationship status: Not on file  . Intimate partner violence    Fear of current or ex partner: Not on file    Emotionally abused: Not on file     Physically abused: Not on file    Forced sexual activity: Not on file  Other Topics Concern  . Not on file  Social History Narrative  . Not on file    Outpatient Encounter Medications as of 07/21/2019  Medication Sig  . lisinopril (ZESTRIL) 20 MG tablet Take 1 tablet (20 mg total) by mouth daily.  . [DISCONTINUED] lisinopril (ZESTRIL) 20 MG tablet Take 1 tablet (20 mg total) by mouth daily.  Marland Kitchen atorvastatin (LIPITOR) 20 MG tablet Take 1 tablet (20 mg total) by mouth daily.  . [DISCONTINUED] atorvastatin (LIPITOR) 20 MG tablet Take 1 tablet (20 mg total) by mouth daily.   No facility-administered encounter medications on file as of 07/21/2019.     Allergies  Allergen Reactions  . Iodine   . Other     IV contrast    Review of Systems  Constitutional: Negative for activity change, appetite change, chills, fatigue and fever.  HENT: Negative.   Eyes: Negative.  Negative for photophobia and visual disturbance.  Respiratory: Negative for cough, chest tightness and shortness of breath.   Cardiovascular: Negative for chest pain, palpitations and leg swelling.  Gastrointestinal: Negative for anal bleeding, blood in stool, constipation, diarrhea, nausea and vomiting.  Endocrine: Negative.  Negative for cold intolerance, heat intolerance, polydipsia, polyphagia and polyuria.  Genitourinary: Negative for dysuria, frequency and urgency.  Musculoskeletal: Negative for arthralgias and myalgias.  Skin: Negative.   Allergic/Immunologic: Negative.   Neurological: Negative for dizziness, tremors, seizures, syncope, facial asymmetry, speech difficulty, weakness, light-headedness, numbness and headaches.  Hematological: Negative.   Psychiatric/Behavioral: Negative for confusion, hallucinations, sleep disturbance and suicidal ideas.  All other systems reviewed and are negative.       Objective:  BP 114/71   Pulse 84   Temp 99.3 F (37.4 C)   Ht 5' 10"  (1.778 m)   Wt 202 lb (91.6 kg)   BMI  28.98 kg/m    Wt Readings from Last 3 Encounters:  07/21/19 202 lb (91.6 kg)  02/09/18 203 lb 12.8 oz (92.4 kg)  06/25/17 191 lb (86.6 kg)    Physical Exam Vitals signs and nursing note reviewed.  Constitutional:      General: He is not in acute distress.    Appearance: Normal appearance. He is well-developed and well-groomed. He is not ill-appearing, toxic-appearing or diaphoretic.  HENT:  Head: Normocephalic and atraumatic.     Jaw: There is normal jaw occlusion.     Right Ear: Hearing normal.     Left Ear: Hearing normal.     Nose: Nose normal.     Mouth/Throat:     Lips: Pink.     Mouth: Mucous membranes are moist.     Pharynx: Oropharynx is clear. Uvula midline.  Eyes:     General: Lids are normal.     Extraocular Movements: Extraocular movements intact.     Conjunctiva/sclera: Conjunctivae normal.     Pupils: Pupils are equal, round, and reactive to light.  Neck:     Musculoskeletal: Normal range of motion and neck supple.     Thyroid: No thyroid mass, thyromegaly or thyroid tenderness.     Vascular: No carotid bruit or JVD.     Trachea: Trachea and phonation normal.  Cardiovascular:     Rate and Rhythm: Normal rate and regular rhythm.     Chest Wall: PMI is not displaced.     Pulses: Normal pulses.     Heart sounds: Normal heart sounds. No murmur. No friction rub. No gallop.   Pulmonary:     Effort: Pulmonary effort is normal. No respiratory distress.     Breath sounds: Normal breath sounds. No wheezing.  Abdominal:     General: Bowel sounds are normal. There is no distension or abdominal bruit.     Palpations: Abdomen is soft. There is no hepatomegaly or splenomegaly.     Tenderness: There is no abdominal tenderness. There is no right CVA tenderness or left CVA tenderness.     Hernia: No hernia is present.  Musculoskeletal: Normal range of motion.     Right lower leg: No edema.     Left lower leg: No edema.  Lymphadenopathy:     Cervical: No cervical  adenopathy.  Skin:    General: Skin is warm and dry.     Capillary Refill: Capillary refill takes less than 2 seconds.     Coloration: Skin is not cyanotic, jaundiced or pale.     Findings: No rash.  Neurological:     General: No focal deficit present.     Mental Status: He is alert and oriented to person, place, and time.     Cranial Nerves: Cranial nerves are intact.     Sensory: Sensation is intact.     Motor: Motor function is intact.     Coordination: Coordination is intact.     Gait: Gait is intact.     Deep Tendon Reflexes: Reflexes are normal and symmetric.  Psychiatric:        Attention and Perception: Attention and perception normal.        Mood and Affect: Mood and affect normal.        Speech: Speech normal.        Behavior: Behavior normal. Behavior is cooperative.        Thought Content: Thought content normal.        Cognition and Memory: Cognition and memory normal.        Judgment: Judgment normal.     Results for orders placed or performed in visit on 04/16/18  CMP14+EGFR  Result Value Ref Range   Glucose 87 65 - 99 mg/dL   BUN 15 6 - 24 mg/dL   Creatinine, Ser 0.90 0.76 - 1.27 mg/dL   GFR calc non Af Amer 103 >59 mL/min/1.73   GFR calc Af Amer 119 >59 mL/min/1.73  BUN/Creatinine Ratio 17 9 - 20   Sodium 139 134 - 144 mmol/L   Potassium 4.2 3.5 - 5.2 mmol/L   Chloride 104 96 - 106 mmol/L   CO2 21 20 - 29 mmol/L   Calcium 9.4 8.7 - 10.2 mg/dL   Total Protein 6.8 6.0 - 8.5 g/dL   Albumin 4.3 3.5 - 5.5 g/dL   Globulin, Total 2.5 1.5 - 4.5 g/dL   Albumin/Globulin Ratio 1.7 1.2 - 2.2   Bilirubin Total 0.4 0.0 - 1.2 mg/dL   Alkaline Phosphatase 65 39 - 117 IU/L   AST 23 0 - 40 IU/L   ALT 32 0 - 44 IU/L       Pertinent labs & imaging results that were available during my care of the patient were reviewed by me and considered in my medical decision making.  Assessment & Plan:  Gavin Landry was seen today for medical management of chronic issues, hypertension  and hyperlipidemia.  Diagnoses and all orders for this visit:  Essential hypertension Diet and exercise encouraged. BP well controlled. Labs pending. Continue below.  -     lisinopril (ZESTRIL) 20 MG tablet; Take 1 tablet (20 mg total) by mouth daily. -     CMP14+EGFR -     Lipid panel  Mixed hyperlipidemia Pt has not been taking due to cost. Discussed getting medications from [facility], $38 for 90 day supply. Pt agrees to this and will restart. Diet and exercise encouraged. Labs pending.  -     atorvastatin (LIPITOR) 20 MG tablet; Take 1 tablet (20 mg total) by mouth daily. -     CMP14+EGFR -     Lipid panel  Tobacco abuse Smoking cessation discussed. Pt has cut back to 10 cigarettes per day vs two packs per day.     Continue all other maintenance medications.  Follow up plan: Return in about 6 months (around 01/21/2020), or if symptoms worsen or fail to improve, for HTN, Lipids.  Continue healthy lifestyle choices, including diet (rich in fruits, vegetables, and lean proteins, and low in salt and simple carbohydrates) and exercise (at least 30 minutes of moderate physical activity daily).  Educational handout given for DASH  The above assessment and management plan was discussed with the patient. The patient verbalized understanding of and has agreed to the management plan. Patient is aware to call the clinic if symptoms persist or worsen. Patient is aware when to return to the clinic for a follow-up visit. Patient educated on when it is appropriate to go to the emergency department.   Monia Pouch, FNP-C [facility] [phone] 07/21/19

## 2019-07-22 LAB — CMP14+EGFR
ALT: 23 IU/L (ref 0–44)
AST: 19 IU/L (ref 0–40)
Albumin/Globulin Ratio: 1.8 (ref 1.2–2.2)
Albumin: 4.4 g/dL (ref 4.0–5.0)
Alkaline Phosphatase: 61 IU/L (ref 39–117)
BUN/Creatinine Ratio: 18 (ref 9–20)
BUN: 17 mg/dL (ref 6–24)
Bilirubin Total: 0.7 mg/dL (ref 0.0–1.2)
CO2: 20 mmol/L (ref 20–29)
Calcium: 9.5 mg/dL (ref 8.7–10.2)
Chloride: 101 mmol/L (ref 96–106)
Creatinine, Ser: 0.96 mg/dL (ref 0.76–1.27)
GFR calc Af Amer: 108 mL/min/{1.73_m2} (ref >59)
GFR calc non Af Amer: 94 mL/min/{1.73_m2} (ref >59)
Globulin, Total: 2.5 g/dL (ref 1.5–4.5)
Glucose: 103 mg/dL — ABNORMAL HIGH (ref 65–99)
Potassium: 4.6 mmol/L (ref 3.5–5.2)
Sodium: 137 mmol/L (ref 134–144)
Total Protein: 6.9 g/dL (ref 6.0–8.5)

## 2019-07-22 LAB — LIPID PANEL
Chol/HDL Ratio: 5.6 ratio — ABNORMAL HIGH (ref 0.0–5.0)
Cholesterol, Total: 252 mg/dL — ABNORMAL HIGH (ref 100–199)
HDL: 45 mg/dL (ref >39)
LDL Calculated: 172 mg/dL — ABNORMAL HIGH (ref 0–99)
Triglycerides: 174 mg/dL — ABNORMAL HIGH (ref 0–149)
VLDL Cholesterol Cal: 35 mg/dL (ref 5–40)

## 2020-01-20 ENCOUNTER — Other Ambulatory Visit: Payer: Self-pay

## 2020-01-23 ENCOUNTER — Ambulatory Visit: Payer: Self-pay | Admitting: Family Medicine

## 2020-01-24 ENCOUNTER — Encounter: Payer: Self-pay | Admitting: Family Medicine

## 2020-02-01 ENCOUNTER — Encounter: Payer: Self-pay | Admitting: Family Medicine

## 2020-02-01 ENCOUNTER — Ambulatory Visit: Payer: Self-pay | Admitting: Family Medicine

## 2020-02-01 ENCOUNTER — Other Ambulatory Visit: Payer: Self-pay

## 2020-02-01 VITALS — BP 126/83 | HR 91 | Temp 98.2°F | Resp 20 | Ht 70.0 in | Wt 198.0 lb

## 2020-02-01 DIAGNOSIS — F39 Unspecified mood [affective] disorder: Secondary | ICD-10-CM

## 2020-02-01 DIAGNOSIS — E782 Mixed hyperlipidemia: Secondary | ICD-10-CM

## 2020-02-01 DIAGNOSIS — Z6828 Body mass index (BMI) 28.0-28.9, adult: Secondary | ICD-10-CM | POA: Diagnosis not present

## 2020-02-01 DIAGNOSIS — G5603 Carpal tunnel syndrome, bilateral upper limbs: Secondary | ICD-10-CM

## 2020-02-01 DIAGNOSIS — I10 Essential (primary) hypertension: Secondary | ICD-10-CM | POA: Diagnosis not present

## 2020-02-01 MED ORDER — PREDNISONE 20 MG PO TABS: ORAL_TABLET | ORAL | 0 refills | Status: DC

## 2020-02-01 NOTE — Progress Notes (Signed)
Subjective:  Patient ID: Gavin Landry, male    DOB: 04-23-1971, 49 y.o.   MRN: [id number]  Patient Care Team: Baruch Gouty, FNP as PCP - General (Family Medicine)   Chief Complaint:  Medical Management of Chronic Issues, Hyperlipidemia, and Hypertension   HPI: Gavin Landry is a 49 y.o. male presenting on 02/01/2020 for Medical Management of Chronic Issues, Hyperlipidemia, and Hypertension   Pt presents today for follow up of chronic medical conditions. He reports he has been doing well since last OV. He was recently treated for an UTI and has recovered well. Pt states his mood is well. No SI or HI. States he is compliant with medications without associated side effects.  He does report bilateral wrist pain with numbness and tingling to his fingers. States this started several weeks ago and is worse after working long hours. 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 States the pain is worse in his right wrist and hand. No known injuries. No neck pain or back pain. No loss of strength or function. He has not tried anything for the pain.   Hypertension This is a chronic problem. The current episode started more than 1 year ago. The problem is controlled. Pertinent negatives include no anxiety, blurred vision, chest pain, headaches, malaise/fatigue, neck pain, orthopnea, palpitations, peripheral edema, PND, shortness of breath or sweats. Risk factors for coronary artery disease include male gender, dyslipidemia and smoking/tobacco exposure. Past treatments include ACE inhibitors. The current treatment provides significant improvement. Compliance problems include diet and exercise.   Hyperlipidemia This is a chronic problem. The current episode started more than 1 year ago. The problem is uncontrolled. Recent lipid tests were reviewed and are high. Factors aggravating his hyperlipidemia include fatty foods. Associated symptoms include myalgias. Pertinent negatives  include no chest pain, focal sensory loss, focal weakness, leg pain or shortness of breath. Current antihyperlipidemic treatment includes statins. The current treatment provides moderate improvement of lipids. Compliance problems include adherence to diet and adherence to exercise.  Risk factors for coronary artery disease include dyslipidemia, hypertension and male sex.       Relevant past medical, surgical, family, and social history reviewed and updated as indicated.  Allergies and medications reviewed and updated. Date reviewed: Chart in Epic.   Past Medical History:  Diagnosis Date  . Allergy   . Arthritis   . Depression   . Seizures ([place])     Past Surgical History:  Procedure Laterality Date  . APPENDECTOMY    . KNEE SURGERY    . VASECTOMY      Social History   Socioeconomic History  . Marital status: Single    Spouse name: Not on file  . Number of children: Not on file  . Years of education: Not on file  . Highest education level: Not on file  Occupational History  . Not on file  Tobacco Use  . Smoking status: Current Every Day Smoker    Packs/day: 0.50    Types: Cigarettes  . Smokeless tobacco: Former Network engineer and Sexual Activity  . Alcohol use: Yes    Alcohol/week: 7.0 standard drinks    Types: 7 Cans of beer per week  . Drug use: No  . Sexual activity: Not on file  Other Topics Concern  . Not on file  Social History Narrative  . Not on file   Social Determinants of Health   Financial Resource Strain:   .  Difficulty of Paying Living Expenses: Not on file  Food Insecurity:   . Worried About Charity fundraiser in the Last Year: Not on file  . Ran Out of Food in the Last Year: Not on file  Transportation Needs:   . Lack of Transportation (Medical): Not on file  . Lack of Transportation (Non-Medical): Not on file  Physical Activity:   . Days of Exercise per Week: Not on file  . Minutes of Exercise per Session: Not on file  Stress:   .  Feeling of Stress : Not on file  Social Connections:   . Frequency of Communication with Friends and Family: Not on file  . Frequency of Social Gatherings with Friends and Family: Not on file  . Attends Religious Services: Not on file  . Active Member of Clubs or Organizations: Not on file  . Attends Archivist Meetings: Not on file  . Marital Status: Not on file  Intimate Partner Violence:   . Fear of Current or Ex-Partner: Not on file  . Emotionally Abused: Not on file  . Physically Abused: Not on file  . Sexually Abused: Not on file    Outpatient Encounter Medications as of 02/01/2020  Medication Sig  . atorvastatin (LIPITOR) 20 MG tablet Take 1 tablet (20 mg total) by mouth daily.  Marland Kitchen lisinopril (ZESTRIL) 20 MG tablet Take 1 tablet (20 mg total) by mouth daily.  . [DISCONTINUED] amoxicillin-clavulanate (AUGMENTIN) 875-125 MG tablet Take 1 tablet by mouth in the morning and at bedtime.  . predniSONE (DELTASONE) 20 MG tablet 2 po at sametime daily for 5 days  . [DISCONTINUED] predniSONE (DELTASONE) 20 MG tablet 2 po at sametime daily for 5 days   No facility-administered encounter medications on file as of 02/01/2020.    Allergies  Allergen Reactions  . Iodine   . Other     IV contrast    Review of Systems  Constitutional: Negative for activity change, appetite change, chills, diaphoresis, fatigue, fever, malaise/fatigue and unexpected weight change.  HENT: Negative.   Eyes: Negative.  Negative for blurred vision, photophobia and visual disturbance.  Respiratory: Negative for cough, chest tightness and shortness of breath.   Cardiovascular: Negative for chest pain, palpitations, orthopnea, leg swelling and PND.  Gastrointestinal: Negative for abdominal pain, blood in stool, constipation, diarrhea, nausea and vomiting.  Endocrine: Negative.  Negative for polyphagia and polyuria.  Genitourinary: Negative for decreased urine volume, difficulty urinating, dysuria,  frequency and urgency.  Musculoskeletal: Positive for arthralgias and myalgias. Negative for back pain, neck pain and neck stiffness.  Skin: Negative.   Allergic/Immunologic: Negative.   Neurological: Positive for numbness. Negative for dizziness, tremors, focal weakness, seizures, syncope, facial asymmetry, speech difficulty, weakness, light-headedness and headaches.  Hematological: Negative.   Psychiatric/Behavioral: Negative for confusion, hallucinations, sleep disturbance and suicidal ideas.  All other systems reviewed and are negative.       Objective:  BP 126/83   Pulse 91   Temp 98.2 F (36.8 C)   Resp 20   Ht _0  (1.778 m)   Wt 198 lb (89.8 kg)   SpO2 95%   BMI 28.41 kg/m    Wt Readings from Last 3 Encounters:  02/01/20 198 lb (89.8 kg)  07/21/19 202 lb (91.6 kg)  02/09/18 203 lb 12.8 oz (92.4 kg)    Physical Exam Vitals and nursing note reviewed.  Constitutional:      General: He is not in acute distress.    Appearance: Normal appearance.  He is well-developed and well-groomed. He is obese. He is not ill-appearing, toxic-appearing or diaphoretic.  HENT:     Head: Normocephalic and atraumatic.     Jaw: There is normal jaw occlusion.     Right Ear: Hearing normal.     Left Ear: Hearing normal.     Nose: Nose normal.     Mouth/Throat:     Lips: Pink.     Mouth: Mucous membranes are moist.     Pharynx: Oropharynx is clear. Uvula midline.  Eyes:     General: Lids are normal.     Extraocular Movements: Extraocular movements intact.     Conjunctiva/sclera: Conjunctivae normal.     Pupils: Pupils are equal, round, and reactive to light.  Neck:     Thyroid: No thyroid mass, thyromegaly or thyroid tenderness.     Vascular: No carotid bruit or JVD.     Trachea: Trachea and phonation normal.  Cardiovascular:     Rate and Rhythm: Normal rate and regular rhythm.     Chest Wall: PMI is not displaced.     Pulses: Normal pulses.     Heart sounds: Normal heart  sounds. No murmur. No friction rub. No gallop.   Pulmonary:     Effort: Pulmonary effort is normal. No respiratory distress.     Breath sounds: Normal breath sounds. No wheezing.  Abdominal:     General: Bowel sounds are normal. There is no distension or abdominal bruit.     Palpations: Abdomen is soft. There is no hepatomegaly or splenomegaly.     Tenderness: There is no abdominal tenderness. There is no right CVA tenderness or left CVA tenderness.     Hernia: No hernia is present.  Musculoskeletal:     Right forearm: Normal.     Left forearm: Normal.     Right wrist: Tenderness present. No swelling, deformity, effusion, lacerations, bony tenderness, snuff box tenderness or crepitus. Normal range of motion. Normal pulse.     Left wrist: Tenderness present. No swelling, deformity, effusion, lacerations, bony tenderness, snuff box tenderness or crepitus. Normal range of motion. Normal pulse.     Right hand: Normal.     Left hand: Normal.     Cervical back: Normal range of motion and neck supple.     Right lower leg: No edema.     Left lower leg: No edema.     Comments: Positive Phalen's Sign bilaterally.  Lymphadenopathy:     Cervical: No cervical adenopathy.  Skin:    General: Skin is warm and dry.     Capillary Refill: Capillary refill takes less than 2 seconds.     Coloration: Skin is not cyanotic, jaundiced or pale.     Findings: No rash.  Neurological:     General: No focal deficit present.     Mental Status: He is alert and oriented to person, place, and time.     Cranial Nerves: Cranial nerves are intact. No cranial nerve deficit.     Sensory: Sensation is intact. No sensory deficit.     Motor: Motor function is intact. No weakness.     Coordination: Coordination is intact. Coordination normal.     Gait: Gait is intact. Gait normal.     Deep Tendon Reflexes: Reflexes are normal and symmetric. Reflexes normal.  Psychiatric:        Attention and Perception: Attention and  perception normal.        Mood and Affect: Mood and affect normal.  Speech: Speech normal.        Behavior: Behavior normal. Behavior is cooperative.        Thought Content: Thought content normal.        Cognition and Memory: Cognition and memory normal.        Judgment: Judgment normal.     Results for orders placed or performed in visit on 07/21/19  CMP14+EGFR  Result Value Ref Range   Glucose 103 (H) 65 - 99 mg/dL   BUN 17 6 - 24 mg/dL   Creatinine, Ser 0.96 0.76 - 1.27 mg/dL   GFR calc non Af Amer 94 >59 mL/min/1.73   GFR calc Af Amer 108 >59 mL/min/1.73   BUN/Creatinine Ratio 18 9 - 20   Sodium 137 134 - 144 mmol/L   Potassium 4.6 3.5 - 5.2 mmol/L   Chloride 101 96 - 106 mmol/L   CO2 20 20 - 29 mmol/L   Calcium 9.5 8.7 - 10.2 mg/dL   Total Protein 6.9 6.0 - 8.5 g/dL   Albumin 4.4 4.0 - 5.0 g/dL   Globulin, Total 2.5 1.5 - 4.5 g/dL   Albumin/Globulin Ratio 1.8 1.2 - 2.2   Bilirubin Total 0.7 0.0 - 1.2 mg/dL   Alkaline Phosphatase 61 39 - 117 IU/L   AST 19 0 - 40 IU/L   ALT 23 0 - 44 IU/L  Lipid panel  Result Value Ref Range   Cholesterol, Total 252 (H) 100 - 199 mg/dL   Triglycerides 174 (H) 0 - 149 mg/dL   HDL 45 >39 mg/dL   VLDL Cholesterol Cal 35 5 - 40 mg/dL   LDL Calculated 172 (H) 0 - 99 mg/dL   Chol/HDL Ratio 5.6 (H) 0.0 - 5.0 ratio       Pertinent labs & imaging results that were available during my care of the patient were reviewed by me and considered in my medical decision making.  Assessment & Plan:  Gavin Landry was seen today for medical management of chronic issues, hyperlipidemia and hypertension.  Diagnoses and all orders for this visit:  Essential hypertension BP well controlled. Changes were not made in regimen today. Goal BP is 130/80. Pt aware to report any persistent high or low readings. DASH diet and exercise encouraged. Exercise at least 150 minutes per week and increase as tolerated. Goal BMI > 25. Stress management encouraged. Avoid  nicotine and tobacco product use. Avoid excessive alcohol and NSAID's. Avoid more than 2000 mg of sodium daily. Medications as prescribed. Follow up as scheduled.  -     CBC with Differential/Platelet -     CMP14+EGFR -     Lipid panel -     Thyroid Panel With TSH  Mixed hyperlipidemia Diet encouraged - increase intake of fresh fruits and vegetables, increase intake of lean proteins. Bake, broil, or grill foods. Avoid fried, greasy, and fatty foods. Avoid fast foods. Increase intake of fiber-rich whole grains. Exercise encouraged - at least 150 minutes per week and advance as tolerated.  Goal BMI < 25. Continue medications as prescribed. Follow up in 3-6 months as discussed.  -     CBC with Differential/Platelet -     CMP14+EGFR -     Lipid panel -     Thyroid Panel With TSH  Mood disorder (Gavin Landry) Well controlled. Will check thyroid function today.  -     Thyroid Panel With TSH  BMI 28.0-28.9,adult Diet and exercise encouraged. Labs pending.  -     CBC with Differential/Platelet -  CMP14+EGFR -     Lipid panel -     Thyroid Panel With TSH  Bilateral carpal tunnel syndrome Positive Phalen's Sign bilaterally. Bilateral wrist splints applied in office. Will burst with steroids. Symptomatic care discussed in detail. Pt aware to report any new, worsening, or persistent symptoms.  -     predniSONE (DELTASONE) 20 MG tablet; 2 po at sametime daily for 5 days     Continue all other maintenance medications.  Follow up plan: Return in about 6 months (around 07/31/2020), or if symptoms worsen or fail to improve.  Continue healthy lifestyle choices, including diet (rich in fruits, vegetables, and lean proteins, and low in salt and simple carbohydrates) and exercise (at least 30 minutes of moderate physical activity daily).  Educational handout given for carpal tunnel syndrome  The above assessment and management plan was discussed with the patient. The patient verbalized understanding of  and has agreed to the management plan. Patient is aware to call the clinic if they develop any new symptoms or if symptoms persist or worsen. Patient is aware when to return to the clinic for a follow-up visit. Patient educated on when it is appropriate to go to the emergency department.   Monia Pouch, FNP-C [facility] [phone]

## 2020-02-01 NOTE — Patient Instructions (Signed)
Carpal Tunnel Syndrome  Carpal tunnel syndrome is a condition that causes pain in your hand and arm. The carpal tunnel is a narrow area that is on the palm side of your wrist. Repeated wrist motion or certain diseases may cause swelling in the tunnel. This swelling can pinch the main nerve in the wrist (median nerve). What are the causes? This condition may be caused by:  Repeated wrist motions.  Wrist injuries.  Arthritis.  A sac of fluid (cyst) or abnormal growth (tumor) in the carpal tunnel.  Fluid buildup during pregnancy. Sometimes the cause is not known. What increases the risk? The following factors may make you more likely to develop this condition:  Having a job in which you move your wrist in the same way many times. This includes jobs like being a butcher or a cashier.  Being a woman.  Having other health conditions, such as: ? Diabetes. ? Obesity. ? A thyroid gland that is not active enough (hypothyroidism). ? Kidney failure. What are the signs or symptoms? Symptoms of this condition include:  A tingling feeling in your fingers.  Tingling or a loss of feeling (numbness) in your hand.  Pain in your entire arm. This pain may get worse when you bend your wrist and elbow for a long time.  Pain in your wrist that goes up your arm to your shoulder.  Pain that goes down into your palm or fingers.  A weak feeling in your hands. You may find it hard to grab and hold items. You may feel worse at night. How is this diagnosed? This condition is diagnosed with a medical history and physical exam. You may also have tests, such as:  Electromyogram (EMG). This test checks the signals that the nerves send to the muscles.  Nerve conduction study. This test checks how well signals pass through your nerves.  Imaging tests, such as X-rays, ultrasound, and MRI. These tests check for what might be the cause of your condition. How is this treated? This condition may be treated  with:  Lifestyle changes. You will be asked to stop or change the activity that caused your problem.  Doing exercise and activities that make bones and muscles stronger (physical therapy).  Learning how to use your hand again (occupational therapy).  Medicines for pain and swelling (inflammation). You may have injections in your wrist.  A wrist splint.  Surgery. Follow these instructions at home: If you have a splint:  Wear the splint as told by your doctor. Remove it only as told by your doctor.  Loosen the splint if your fingers: ? Tingle. ? Lose feeling (become numb). ? Turn cold and blue.  Keep the splint clean.  If the splint is not waterproof: ? Do not let it get wet. ? Cover it with a watertight covering when you take a bath or a shower. Managing pain, stiffness, and swelling   If told, put ice on the painful area: ? If you have a removable splint, remove it as told by your doctor. ? Put ice in a plastic bag. ? Place a towel between your skin and the bag. ? Leave the ice on for 20 minutes, 2-3 times per day. General instructions  Take over-the-counter and prescription medicines only as told by your doctor.  Rest your wrist from any activity that may cause pain. If needed, talk with your boss at work about changes that can help your wrist heal.  Do any exercises as told by your doctor,   physical therapist, or occupational therapist.  Keep all follow-up visits as told by your doctor. This is important. Contact a doctor if:  You have new symptoms.  Medicine does not help your pain.  Your symptoms get worse. Get help right away if:  You have very bad numbness or tingling in your wrist or hand. Summary  Carpal tunnel syndrome is a condition that causes pain in your hand and arm.  It is often caused by repeated wrist motions.  Lifestyle changes and medicines are used to treat this problem. Surgery may help in very bad cases.  Follow your doctor's  instructions about wearing a splint, resting your wrist, keeping follow-up visits, and calling for help. This information is not intended to replace advice given to you by your health care provider. Make sure you discuss any questions you have with your health care provider. Document Revised: 04/09/2018 Document Reviewed: 04/09/2018 [employer] Patient Education  2020 [employer]  

## 2020-02-02 LAB — CBC WITH DIFFERENTIAL/PLATELET
Basophils Absolute: 0 10*3/uL (ref 0.0–0.2)
Basos: 1 %
EOS (ABSOLUTE): 0.1 10*3/uL (ref 0.0–0.4)
Eos: 1 %
Hematocrit: 38.5 % (ref 37.5–51.0)
Hemoglobin: 13.3 g/dL (ref 13.0–17.7)
Immature Grans (Abs): 0 10*3/uL (ref 0.0–0.1)
Immature Granulocytes: 0 %
Lymphocytes Absolute: 1.8 10*3/uL (ref 0.7–3.1)
Lymphs: 31 %
MCH: 31 pg (ref 26.6–33.0)
MCHC: 34.5 g/dL (ref 31.5–35.7)
MCV: 90 fL (ref 79–97)
Monocytes Absolute: 0.5 10*3/uL (ref 0.1–0.9)
Monocytes: 8 %
Neutrophils Absolute: 3.5 10*3/uL (ref 1.4–7.0)
Neutrophils: 59 %
Platelets: 330 10*3/uL (ref 150–450)
RBC: 4.29 x10E6/uL (ref 4.14–5.80)
RDW: 12.4 % (ref 11.6–15.4)
WBC: 5.8 10*3/uL (ref 3.4–10.8)

## 2020-02-02 LAB — LIPID PANEL
Chol/HDL Ratio: 4.9 ratio (ref 0.0–5.0)
Cholesterol, Total: 146 mg/dL (ref 100–199)
HDL: 30 mg/dL — ABNORMAL LOW (ref >39)
LDL Chol Calc (NIH): 98 mg/dL (ref 0–99)
Triglycerides: 93 mg/dL (ref 0–149)
VLDL Cholesterol Cal: 18 mg/dL (ref 5–40)

## 2020-02-02 LAB — CMP14+EGFR
ALT: 24 IU/L (ref 0–44)
AST: 19 IU/L (ref 0–40)
Albumin/Globulin Ratio: 1.5 (ref 1.2–2.2)
Albumin: 4 g/dL (ref 4.0–5.0)
Alkaline Phosphatase: 72 IU/L (ref 39–117)
BUN/Creatinine Ratio: 15 (ref 9–20)
BUN: 14 mg/dL (ref 6–24)
Bilirubin Total: 0.4 mg/dL (ref 0.0–1.2)
CO2: 20 mmol/L (ref 20–29)
Calcium: 9.1 mg/dL (ref 8.7–10.2)
Chloride: 102 mmol/L (ref 96–106)
Creatinine, Ser: 0.96 mg/dL (ref 0.76–1.27)
GFR calc Af Amer: 108 mL/min/{1.73_m2} (ref >59)
GFR calc non Af Amer: 94 mL/min/{1.73_m2} (ref >59)
Globulin, Total: 2.7 g/dL (ref 1.5–4.5)
Glucose: 103 mg/dL — ABNORMAL HIGH (ref 65–99)
Potassium: 4.5 mmol/L (ref 3.5–5.2)
Sodium: 137 mmol/L (ref 134–144)
Total Protein: 6.7 g/dL (ref 6.0–8.5)

## 2020-02-02 LAB — THYROID PANEL WITH TSH
Free Thyroxine Index: 2.4 (ref 1.2–4.9)
T3 Uptake Ratio: 31 % (ref 24–39)
T4, Total: 7.9 ug/dL (ref 4.5–12.0)
TSH: 0.562 u[IU]/mL (ref 0.450–4.500)

## 2020-03-29 ENCOUNTER — Encounter: Payer: Self-pay | Admitting: *Deleted

## 2020-08-01 ENCOUNTER — Ambulatory Visit: Payer: Self-pay | Admitting: Family Medicine

## 2020-08-01 ENCOUNTER — Encounter: Payer: Self-pay | Admitting: Family Medicine

## 2020-08-01 ENCOUNTER — Ambulatory Visit (INDEPENDENT_AMBULATORY_CARE_PROVIDER_SITE_OTHER): Payer: Commercial Managed Care - PPO | Admitting: Family Medicine

## 2020-08-01 ENCOUNTER — Other Ambulatory Visit: Payer: Self-pay

## 2020-08-01 VITALS — BP 123/82 | HR 96 | Temp 98.5°F | Ht 70.0 in | Wt 194.0 lb

## 2020-08-01 DIAGNOSIS — E782 Mixed hyperlipidemia: Secondary | ICD-10-CM

## 2020-08-01 DIAGNOSIS — I10 Essential (primary) hypertension: Secondary | ICD-10-CM

## 2020-08-01 DIAGNOSIS — Z720 Tobacco use: Secondary | ICD-10-CM

## 2020-08-01 DIAGNOSIS — F39 Unspecified mood [affective] disorder: Secondary | ICD-10-CM | POA: Diagnosis not present

## 2020-08-01 MED ORDER — ATORVASTATIN CALCIUM 20 MG PO TABS: 20.0000 mg | ORAL_TABLET | Freq: Every day | ORAL | 1 refills | Status: DC

## 2020-08-01 MED ORDER — LISINOPRIL 20 MG PO TABS: 20.0000 mg | ORAL_TABLET | Freq: Every day | ORAL | 1 refills | Status: DC

## 2020-08-01 NOTE — Patient Instructions (Signed)
DASH Eating Plan DASH stands for "Dietary Approaches to Stop Hypertension." The DASH eating plan is a healthy eating plan that has been shown to reduce high blood pressure (hypertension). It may also reduce your risk for type 2 diabetes, heart disease, and stroke. The DASH eating plan may also help with weight loss. What are tips for following this plan?  General guidelines  Avoid eating more than 2,300 mg (milligrams) of salt (sodium) a day. If you have hypertension, you may need to reduce your sodium intake to 1,500 mg a day.  Limit alcohol intake to no more than 1 drink a day for nonpregnant women and 2 drinks a day for men. One drink equals 12 oz of beer, 5 oz of wine, or 1 oz of hard liquor.  Work with your health care provider to maintain a healthy body weight or to lose weight. Ask what an ideal weight is for you.  Get at least 30 minutes of exercise that causes your heart to beat faster (aerobic exercise) most days of the week. Activities may include walking, swimming, or biking.  Work with your health care provider or diet and nutrition specialist (dietitian) to adjust your eating plan to your individual calorie needs. Reading food labels   Check food labels for the amount of sodium per serving. Choose foods with less than 5 percent of the Daily Value of sodium. Generally, foods with less than 300 mg of sodium per serving fit into this eating plan.  To find whole grains, look for the word "whole" as the first word in the ingredient list. Shopping  Buy products labeled as "low-sodium" or "no salt added."  Buy fresh foods. Avoid canned foods and premade or frozen meals. Cooking  Avoid adding salt when cooking. Use salt-free seasonings or herbs instead of table salt or sea salt. Check with your health care provider or pharmacist before using salt substitutes.  Do not fry foods. Cook foods using healthy methods such as baking, boiling, grilling, and broiling instead.  Cook with  heart-healthy oils, such as olive, canola, soybean, or sunflower oil. Meal planning  Eat a balanced diet that includes: ? 5 or more servings of fruits and vegetables each day. At each meal, try to fill half of your plate with fruits and vegetables. ? Up to 6-8 servings of whole grains each day. ? Less than 6 oz of lean meat, poultry, or fish each day. A 3-oz serving of meat is about the same size as a deck of cards. One egg equals 1 oz. ? 2 servings of low-fat dairy each day. ? A serving of nuts, seeds, or beans 5 times each week. ? Heart-healthy fats. Healthy fats called Omega-3 fatty acids are found in foods such as flaxseeds and coldwater fish, like sardines, salmon, and mackerel.  Limit how much you eat of the following: ? Canned or prepackaged foods. ? Food that is high in trans fat, such as fried foods. ? Food that is high in saturated fat, such as fatty meat. ? Sweets, desserts, sugary drinks, and other foods with added sugar. ? Full-fat dairy products.  Do not salt foods before eating.  Try to eat at least 2 vegetarian meals each week.  Eat more home-cooked food and less restaurant, buffet, and fast food.  When eating at a restaurant, ask that your food be prepared with less salt or no salt, if possible. What foods are recommended? The items listed may not be a complete list. Talk with your dietitian about   what dietary choices are best for you. Grains Whole-grain or whole-wheat bread. Whole-grain or whole-wheat pasta. Brown rice. Oatmeal. Quinoa. Bulgur. Whole-grain and low-sodium cereals. Pita bread. Low-fat, low-sodium crackers. Whole-wheat flour tortillas. Vegetables Fresh or frozen vegetables (raw, steamed, roasted, or grilled). Low-sodium or reduced-sodium tomato and vegetable juice. Low-sodium or reduced-sodium tomato sauce and tomato paste. Low-sodium or reduced-sodium canned vegetables. Fruits All fresh, dried, or frozen fruit. Canned fruit in natural juice (without  added sugar). Meat and other protein foods Skinless chicken or turkey. Ground chicken or turkey. Pork with fat trimmed off. Fish and seafood. Egg whites. Dried beans, peas, or lentils. Unsalted nuts, nut butters, and seeds. Unsalted canned beans. Lean cuts of beef with fat trimmed off. Low-sodium, lean deli meat. Dairy Low-fat (1%) or fat-free (skim) milk. Fat-free, low-fat, or reduced-fat cheeses. Nonfat, low-sodium ricotta or cottage cheese. Low-fat or nonfat yogurt. Low-fat, low-sodium cheese. Fats and oils Soft margarine without trans fats. Vegetable oil. Low-fat, reduced-fat, or light mayonnaise and salad dressings (reduced-sodium). Canola, safflower, olive, soybean, and sunflower oils. Avocado. Seasoning and other foods Herbs. Spices. Seasoning mixes without salt. Unsalted popcorn and pretzels. Fat-free sweets. What foods are not recommended? The items listed may not be a complete list. Talk with your dietitian about what dietary choices are best for you. Grains Baked goods made with fat, such as croissants, muffins, or some breads. Dry pasta or rice meal packs. Vegetables Creamed or fried vegetables. Vegetables in a cheese sauce. Regular canned vegetables (not low-sodium or reduced-sodium). Regular canned tomato sauce and paste (not low-sodium or reduced-sodium). Regular tomato and vegetable juice (not low-sodium or reduced-sodium). Pickles. Olives. Fruits Canned fruit in a light or heavy syrup. Fried fruit. Fruit in cream or butter sauce. Meat and other protein foods Fatty cuts of meat. Ribs. Fried meat. Bacon. Sausage. Bologna and other processed lunch meats. Salami. Fatback. Hotdogs. Bratwurst. Salted nuts and seeds. Canned beans with added salt. Canned or smoked fish. Whole eggs or egg yolks. Chicken or turkey with skin. Dairy Whole or 2% milk, cream, and half-and-half. Whole or full-fat cream cheese. Whole-fat or sweetened yogurt. Full-fat cheese. Nondairy creamers. Whipped toppings.  Processed cheese and cheese spreads. Fats and oils Butter. Stick margarine. Lard. Shortening. Ghee. Bacon fat. Tropical oils, such as coconut, palm kernel, or palm oil. Seasoning and other foods Salted popcorn and pretzels. Onion salt, garlic salt, seasoned salt, table salt, and sea salt. Worcestershire sauce. Tartar sauce. Barbecue sauce. Teriyaki sauce. Soy sauce, including reduced-sodium. Steak sauce. Canned and packaged gravies. Fish sauce. Oyster sauce. Cocktail sauce. Horseradish that you find on the shelf. Ketchup. Mustard. Meat flavorings and tenderizers. Bouillon cubes. Hot sauce and Tabasco sauce. Premade or packaged marinades. Premade or packaged taco seasonings. Relishes. Regular salad dressings. Where to find more information:  National Heart, Lung, and Blood Institute: www.nhlbi.nih.gov  American Heart Association: www.heart.org Summary  The DASH eating plan is a healthy eating plan that has been shown to reduce high blood pressure (hypertension). It may also reduce your risk for type 2 diabetes, heart disease, and stroke.  With the DASH eating plan, you should limit salt (sodium) intake to 2,300 mg a day. If you have hypertension, you may need to reduce your sodium intake to 1,500 mg a day.  When on the DASH eating plan, aim to eat more fresh fruits and vegetables, whole grains, lean proteins, low-fat dairy, and heart-healthy fats.  Work with your health care provider or diet and nutrition specialist (dietitian) to adjust your eating plan to your   individual calorie needs. This information is not intended to replace advice given to you by your health care provider. Make sure you discuss any questions you have with your health care provider. Document Revised: 11/13/2017 Document Reviewed: 11/24/2016 Elsevier Patient Education  2020 Elsevier Inc.  

## 2020-08-01 NOTE — Progress Notes (Signed)
Assessment & Plan:  1. Essential hypertension - Well controlled on current regimen. Education provided on the DASH diet.  - lisinopril (ZESTRIL) 20 MG tablet; Take 1 tablet (20 mg total) by mouth daily.  Dispense: 90 tablet; Refill: 1  2. Mixed hyperlipidemia - Well controlled on current regimen.  - atorvastatin (LIPITOR) 20 MG tablet; Take 1 tablet (20 mg total) by mouth daily.  Dispense: 90 tablet; Refill: 1  3. Mood disorder ([place]) - Well controlled without medication.  4. Tobacco abuse - Patient not interested in smoking cessation.    Return in about 6 months (around 02/01/2021) for annual physical.  Hendricks Limes, MSN, APRN, FNP-C [facility]  Subjective:    Patient ID: Gavin Landry, male    DOB: Aug 15, 1971, 49 y.o.   MRN: [id number]  Patient Care Team: Loman Brooklyn, FNP as PCP - General (Family Medicine)   Chief Complaint:  Chief Complaint  Patient presents with  . Establish Care    Rakes pt   . Hypertension    check up of chronic medical conditons    HPI: Gavin Landry is a 49 y.o. male presenting on 08/01/2020 for Establish Care (Rakes pt ) and Hypertension (check up of chronic medical conditons)  Hypertension: Patient here for follow-up of elevated blood pressure. He is exercising and is adherent to low salt diet.  Blood pressure is well controlled at home. Cardiac symptoms none. Cardiovascular risk factors: dyslipidemia, hypertension, male gender and smoking/ tobacco exposure. Use of agents associated with hypertension: none. History of target organ damage: none.  Hyperlipidemia: Patient presents with hyperlipidemia. His last labs showed Total cholesterol of 146, HDL 30, LDL 98,  Triglycerides 93. There is not a family history of hyperlipidemia. There is not a family history of early ischemia heart disease.   New complaints: None  Social history:  Relevant past medical, surgical, family and social history reviewed and updated  as indicated. Interim medical history since our last visit reviewed.  Allergies and medications reviewed and updated.  DATA REVIEWED: CHART IN EPIC  ROS: Negative unless specifically indicated above in HPI.    Current Outpatient Medications:  .  atorvastatin (LIPITOR) 20 MG tablet, Take 1 tablet (20 mg total) by mouth daily., Disp: 90 tablet, Rfl: 3 .  lisinopril (ZESTRIL) 20 MG tablet, Take 1 tablet (20 mg total) by mouth daily., Disp: 90 tablet, Rfl: 3   Allergies  Allergen Reactions  . Iodine   . Other     IV contrast   Past Medical History:  Diagnosis Date  . Allergy   . Arthritis   . Depression   . Seizures ([place])     Past Surgical History:  Procedure Laterality Date  . APPENDECTOMY    . KNEE SURGERY    . VASECTOMY      Social History   Socioeconomic History  . Marital status: Single    Spouse name: Not on file  . Number of children: Not on file  . Years of education: Not on file  . Highest education level: Not on file  Occupational History  . Not on file  Tobacco Use  . Smoking status: Current Every Day Smoker    Packs/day: 0.50    Types: Cigarettes  . Smokeless tobacco: Former Network engineer and Sexual Activity  . Alcohol use: Yes    Alcohol/week: 7.0 standard drinks    Types: 7 Cans of beer per week  . Drug use: No  . Sexual activity:  Not on file  Other Topics Concern  . Not on file  Social History Narrative  . Not on file   Social Determinants of Health   Financial Resource Strain:   . Difficulty of Paying Living Expenses:   Food Insecurity:   . Worried About Charity fundraiser in the Last Year:   . Arboriculturist in the Last Year:   Transportation Needs:   . Film/video editor (Medical):   Marland Kitchen Lack of Transportation (Non-Medical):   Physical Activity:   . Days of Exercise per Week:   . Minutes of Exercise per Session:   Stress:   . Feeling of Stress :   Social Connections:   . Frequency of Communication with Friends and Family:     . Frequency of Social Gatherings with Friends and Family:   . Attends Religious Services:   . Active Member of Clubs or Organizations:   . Attends Archivist Meetings:   Marland Kitchen Marital Status:   Intimate Partner Violence:   . Fear of Current or Ex-Partner:   . Emotionally Abused:   Marland Kitchen Physically Abused:   . Sexually Abused:         Objective:    BP 123/82   Pulse 96   Temp 98.5 F (36.9 C) (Temporal)   Ht 5\' 10"  (1.778 m)   Wt 194 lb (88 kg)   SpO2 100%   BMI 27.84 kg/m   Wt Readings from Last 3 Encounters:  08/01/20 194 lb (88 kg)  02/01/20 198 lb (89.8 kg)  07/21/19 202 lb (91.6 kg)    Physical Exam Vitals reviewed.  Constitutional:      General: He is not in acute distress.    Appearance: Normal appearance. He is overweight. He is not ill-appearing, toxic-appearing or diaphoretic.  HENT:     Head: Normocephalic and atraumatic.  Eyes:     General: No scleral icterus.       Right eye: No discharge.        Left eye: No discharge.     Conjunctiva/sclera: Conjunctivae normal.  Cardiovascular:     Rate and Rhythm: Normal rate and regular rhythm.     Heart sounds: Normal heart sounds. No murmur heard.  No friction rub. No gallop.   Pulmonary:     Effort: Pulmonary effort is normal. No respiratory distress.     Breath sounds: Normal breath sounds. No stridor. No wheezing, rhonchi or rales.  Musculoskeletal:        General: Normal range of motion.     Cervical back: Normal range of motion.  Skin:    General: Skin is warm and dry.  Neurological:     Mental Status: He is alert and oriented to person, place, and time. Mental status is at baseline.  Psychiatric:        Mood and Affect: Mood normal.        Behavior: Behavior normal.        Thought Content: Thought content normal.        Judgment: Judgment normal.     Lab Results  Component Value Date   TSH 0.562 02/01/2020   Lab Results  Component Value Date   WBC 5.8 02/01/2020   HGB 13.3 02/01/2020    HCT 38.5 02/01/2020   MCV 90 02/01/2020   PLT 330 02/01/2020   Lab Results  Component Value Date   NA 137 02/01/2020   K 4.5 02/01/2020   CO2 20 02/01/2020  GLUCOSE 103 (H) 02/01/2020   BUN 14 02/01/2020   CREATININE 0.96 02/01/2020   BILITOT 0.4 02/01/2020   ALKPHOS 72 02/01/2020   AST 19 02/01/2020   ALT 24 02/01/2020   PROT 6.7 02/01/2020   ALBUMIN 4.0 02/01/2020   CALCIUM 9.1 02/01/2020   Lab Results  Component Value Date   CHOL 146 02/01/2020   Lab Results  Component Value Date   HDL 30 (L) 02/01/2020   Lab Results  Component Value Date   LDLCALC 98 02/01/2020   Lab Results  Component Value Date   TRIG 93 02/01/2020   Lab Results  Component Value Date   CHOLHDL 4.9 02/01/2020   No results found for: HGBA1C

## 2021-02-01 ENCOUNTER — Ambulatory Visit (INDEPENDENT_AMBULATORY_CARE_PROVIDER_SITE_OTHER): Payer: Commercial Managed Care - PPO | Admitting: Family Medicine

## 2021-02-01 ENCOUNTER — Encounter: Payer: Self-pay | Admitting: Family Medicine

## 2021-02-01 ENCOUNTER — Other Ambulatory Visit: Payer: Self-pay

## 2021-02-01 VITALS — BP 137/94 | HR 87 | Temp 98.2°F | Ht 70.0 in | Wt 192.0 lb

## 2021-02-01 DIAGNOSIS — Z1211 Encounter for screening for malignant neoplasm of colon: Secondary | ICD-10-CM

## 2021-02-01 DIAGNOSIS — I10 Essential (primary) hypertension: Secondary | ICD-10-CM

## 2021-02-01 DIAGNOSIS — E782 Mixed hyperlipidemia: Secondary | ICD-10-CM

## 2021-02-01 DIAGNOSIS — Z0000 Encounter for general adult medical examination without abnormal findings: Secondary | ICD-10-CM

## 2021-02-01 DIAGNOSIS — Z0001 Encounter for general adult medical examination with abnormal findings: Secondary | ICD-10-CM | POA: Diagnosis not present

## 2021-02-01 DIAGNOSIS — R200 Anesthesia of skin: Secondary | ICD-10-CM

## 2021-02-01 DIAGNOSIS — R202 Paresthesia of skin: Secondary | ICD-10-CM

## 2021-02-01 MED ORDER — ATORVASTATIN CALCIUM 20 MG PO TABS: 20.0000 mg | ORAL_TABLET | Freq: Every day | ORAL | 1 refills | Status: DC

## 2021-02-01 MED ORDER — LISINOPRIL 40 MG PO TABS: 40.0000 mg | ORAL_TABLET | Freq: Every day | ORAL | 1 refills | Status: DC

## 2021-02-01 NOTE — Progress Notes (Addendum)
Assessment & Plan:  1. Well adult exam Preventive health education provided.  Patient declined hepatitis C screening, influenza, tetanus, and COVID-19 vaccines.  Cologuard ordered. - CBC with Differential/Platelet; Future - CMP14+EGFR; Future - Lipid panel; Future - PSA, total and free; Future  2. Essential hypertension Uncontrolled.  Lisinopril increased from 20 mg to 40 mg once daily.  Diet and exercise encouraged. - lisinopril (ZESTRIL) 40 MG tablet; Take 1 tablet (40 mg total) by mouth daily.  Dispense: 90 tablet; Refill: 1 - CBC with Differential/Platelet; Future - CMP14+EGFR; Future - Lipid panel; Future  3. Mixed hyperlipidemia Labs today to assess. - atorvastatin (LIPITOR) 20 MG tablet; Take 1 tablet (20 mg total) by mouth daily.  Dispense: 90 tablet; Refill: 1 - CMP14+EGFR; Future - Lipid panel; Future  4. Colon cancer screening - Cologuard  5. Numbness and tingling in both hands - Ambulatory referral to Orthopedic Surgery   Follow-up: Return in about 3 months (around 05/01/2021) for HTN.   Hendricks Limes, MSN, APRN, FNP-C [facility]  Subjective:  Patient ID: Gavin Landry, male    DOB: 27-Feb-1971  Age: 50 y.o. MRN: [id number]  Patient Care Team: Loman Brooklyn, FNP as PCP - General (Family Medicine)   CC:  Chief Complaint  Patient presents with  . Annual Exam    HPI CARLES FLOREA presents for his annual physical.  Occupation: Biomedical engineer, Marital status: married, Substance use: none Diet: "lots of beer", Exercise: working out Last eye exam: 20+ years ago Last dental exam: 4 years ago Last colonoscopy: never Hepatitis C Screening: declined PSA: never Immunizations: Flu Vaccine: declined Tdap Vaccine: declined  COVID-19 Vaccine: declined  DEPRESSION SCREENING PHQ 2/9 Scores 02/01/2021 08/01/2020 02/01/2020 07/21/2019 04/20/2019 02/09/2018 03/06/2017  PHQ - 2 Score 0 0 0 0 0 0 0  PHQ- 9 Score 4 0 - - - - -    Hypertension:  Patient does not monitor his blood pressure at home.  He is not following any special diet.  He does drink a lot of alcohol.  He is working out in [employer].  Patient reports his right thumb pointer and middle fingers staying numb and tingling.  They are very sensitive to the touch.  He reports he is unable to grab anything unless he has a glove on.  The pain is very severe and shoots up his arm.  He does also happen on the left side but not as severely.   Review of Systems  Constitutional: Negative for chills, fever, malaise/fatigue and weight loss.  HENT: Negative for congestion, ear discharge, ear pain, nosebleeds, sinus pain, sore throat and tinnitus.   Eyes: Negative for blurred vision, double vision, pain, discharge and redness.  Respiratory: Negative for cough, shortness of breath and wheezing.   Cardiovascular: Negative for chest pain, palpitations and leg swelling.  Gastrointestinal: Negative for abdominal pain, constipation, diarrhea, heartburn, nausea and vomiting.  Genitourinary: Negative for dysuria, frequency and urgency.  Musculoskeletal: Positive for back pain and joint pain. Negative for myalgias.  Skin: Negative for rash.  Neurological: Negative for dizziness, seizures, weakness and headaches.  Psychiatric/Behavioral: Negative for depression, substance abuse and suicidal ideas. The patient is not nervous/anxious.      Current Outpatient Medications:  .  atorvastatin (LIPITOR) 20 MG tablet, Take 1 tablet (20 mg total) by mouth daily., Disp: 90 tablet, Rfl: 1 .  lisinopril (ZESTRIL) 20 MG tablet, Take 1 tablet (20 mg total) by mouth daily., Disp: 90 tablet, Rfl: 1  Allergies  Allergen Reactions  . Iodine   . Other     IV contrast    Past Medical History:  Diagnosis Date  . Allergy   . Arthritis   . Depression   . Hyperlipidemia   . Hypertension   . Seizures ([place])     Past Surgical History:  Procedure Laterality Date  . APPENDECTOMY    . KNEE SURGERY    .  VASECTOMY      Family History  Problem Relation Age of Onset  . Depression Mother   . Diabetes Mother   . Heart disease Mother   . Hypertension Mother   . Heart attack Mother     Social History   Socioeconomic History  . Marital status: Single    Spouse name: Not on file  . Number of children: Not on file  . Years of education: Not on file  . Highest education level: Not on file  Occupational History  . Not on file  Tobacco Use  . Smoking status: Current Every Day Smoker    Packs/day: 0.50    Types: Cigarettes  . Smokeless tobacco: Former Network engineer and Sexual Activity  . Alcohol use: Yes    Alcohol/week: 7.0 standard drinks    Types: 7 Cans of beer per week  . Drug use: No  . Sexual activity: Not on file  Other Topics Concern  . Not on file  Social History Narrative  . Not on file   Social Determinants of Health   Financial Resource Strain: Not on file  Food Insecurity: Not on file  Transportation Needs: Not on file  Physical Activity: Not on file  Stress: Not on file  Social Connections: Not on file  Intimate Partner Violence: Not on file      Objective:    BP (!) 146/90   Pulse 87   Temp 98.2 F (36.8 C) (Temporal)   Ht 5' 10"  (1.778 m)   Wt 192 lb (87.1 kg)   SpO2 90%   BMI 27.55 kg/m   Wt Readings from Last 3 Encounters:  02/01/21 192 lb (87.1 kg)  08/01/20 194 lb (88 kg)  02/01/20 198 lb (89.8 kg)    Physical Exam Vitals reviewed.  Constitutional:      General: He is not in acute distress.    Appearance: Normal appearance. He is overweight. He is not ill-appearing, toxic-appearing or diaphoretic.  HENT:     Head: Normocephalic and atraumatic.     Right Ear: Tympanic membrane, ear canal and external ear normal. There is no impacted cerumen.     Left Ear: Tympanic membrane, ear canal and external ear normal. There is no impacted cerumen.     Nose: Nose normal. No congestion or rhinorrhea.     Mouth/Throat:     Mouth: Mucous  membranes are moist.     Pharynx: Oropharynx is clear. No oropharyngeal exudate or posterior oropharyngeal erythema.  Eyes:     General: No scleral icterus.       Right eye: No discharge.        Left eye: No discharge.     Conjunctiva/sclera: Conjunctivae normal.     Pupils: Pupils are equal, round, and reactive to light.  Neck:     Vascular: No carotid bruit.  Cardiovascular:     Rate and Rhythm: Normal rate and regular rhythm.     Heart sounds: Normal heart sounds. No murmur heard. No friction rub. No gallop.   Pulmonary:  Effort: Pulmonary effort is normal. No respiratory distress.     Breath sounds: Normal breath sounds. No stridor. No wheezing, rhonchi or rales.  Abdominal:     General: Abdomen is flat. Bowel sounds are normal. There is no distension.     Palpations: Abdomen is soft. There is no mass.     Tenderness: There is no abdominal tenderness. There is no guarding or rebound.     Hernia: No hernia is present.  Musculoskeletal:        General: Normal range of motion.     Cervical back: Normal range of motion and neck supple. No rigidity. No muscular tenderness.     Right lower leg: No edema.     Left lower leg: No edema.  Lymphadenopathy:     Cervical: No cervical adenopathy.  Skin:    General: Skin is warm and dry.     Capillary Refill: Capillary refill takes less than 2 seconds.  Neurological:     General: No focal deficit present.     Mental Status: He is alert and oriented to person, place, and time. Mental status is at baseline.  Psychiatric:        Mood and Affect: Mood normal.        Behavior: Behavior normal.        Thought Content: Thought content normal.        Judgment: Judgment normal.     Lab Results  Component Value Date   TSH 0.562 02/01/2020   Lab Results  Component Value Date   WBC 5.8 02/01/2020   HGB 13.3 02/01/2020   HCT 38.5 02/01/2020   MCV 90 02/01/2020   PLT 330 02/01/2020   Lab Results  Component Value Date   NA 137  02/01/2020   K 4.5 02/01/2020   CO2 20 02/01/2020   GLUCOSE 103 (H) 02/01/2020   BUN 14 02/01/2020   CREATININE 0.96 02/01/2020   BILITOT 0.4 02/01/2020   ALKPHOS 72 02/01/2020   AST 19 02/01/2020   ALT 24 02/01/2020   PROT 6.7 02/01/2020   ALBUMIN 4.0 02/01/2020   CALCIUM 9.1 02/01/2020   Lab Results  Component Value Date   CHOL 146 02/01/2020   Lab Results  Component Value Date   HDL 30 (L) 02/01/2020   Lab Results  Component Value Date   LDLCALC 98 02/01/2020   Lab Results  Component Value Date   TRIG 93 02/01/2020   Lab Results  Component Value Date   CHOLHDL 4.9 02/01/2020   No results found for: HGBA1C

## 2021-02-01 NOTE — Patient Instructions (Signed)

## 2021-02-06 ENCOUNTER — Encounter: Payer: Self-pay | Admitting: Orthopedic Surgery

## 2021-02-19 ENCOUNTER — Encounter: Payer: Self-pay | Admitting: Orthopedic Surgery

## 2021-02-26 ENCOUNTER — Telehealth: Payer: Self-pay | Admitting: Family Medicine

## 2021-02-26 NOTE — Telephone Encounter (Signed)
Can we please call patient and ask him to return for his lab work that was ordered at his physical?

## 2021-02-26 NOTE — Telephone Encounter (Signed)
Attempted to contact patient. VM not set up.

## 2021-05-01 ENCOUNTER — Encounter: Payer: Self-pay | Admitting: Family Medicine

## 2021-05-01 ENCOUNTER — Ambulatory Visit: Payer: Commercial Managed Care - PPO | Admitting: Family Medicine

## 2021-05-01 ENCOUNTER — Other Ambulatory Visit: Payer: Self-pay

## 2021-05-01 VITALS — BP 122/80 | HR 85 | Temp 98.4°F | Ht 70.0 in | Wt 184.2 lb

## 2021-05-01 DIAGNOSIS — I10 Essential (primary) hypertension: Secondary | ICD-10-CM

## 2021-05-01 DIAGNOSIS — F410 Panic disorder [episodic paroxysmal anxiety] without agoraphobia: Secondary | ICD-10-CM | POA: Diagnosis not present

## 2021-05-01 DIAGNOSIS — F4323 Adjustment disorder with mixed anxiety and depressed mood: Secondary | ICD-10-CM

## 2021-05-01 DIAGNOSIS — E782 Mixed hyperlipidemia: Secondary | ICD-10-CM

## 2021-05-01 DIAGNOSIS — Z1159 Encounter for screening for other viral diseases: Secondary | ICD-10-CM

## 2021-05-01 DIAGNOSIS — Z1211 Encounter for screening for malignant neoplasm of colon: Secondary | ICD-10-CM

## 2021-05-01 DIAGNOSIS — Z23 Encounter for immunization: Secondary | ICD-10-CM | POA: Diagnosis not present

## 2021-05-01 DIAGNOSIS — R3989 Other symptoms and signs involving the genitourinary system: Secondary | ICD-10-CM

## 2021-05-01 LAB — URINALYSIS, ROUTINE W REFLEX MICROSCOPIC
Bilirubin, UA: NEGATIVE
Glucose, UA: NEGATIVE
Ketones, UA: NEGATIVE
Leukocytes,UA: NEGATIVE
Nitrite, UA: NEGATIVE
Protein,UA: NEGATIVE
RBC, UA: NEGATIVE
Specific Gravity, UA: 1.025 (ref 1.005–1.030)
Urobilinogen, Ur: 1 mg/dL (ref 0.2–1.0)
pH, UA: 6.5 (ref 5.0–7.5)

## 2021-05-01 MED ORDER — ESCITALOPRAM OXALATE 10 MG PO TABS: 10.0000 mg | ORAL_TABLET | Freq: Every day | ORAL | 2 refills | Status: DC

## 2021-05-01 MED ORDER — ALPRAZOLAM 0.25 MG PO TABS: 0.2500 mg | ORAL_TABLET | Freq: Every day | ORAL | 0 refills | Status: DC | PRN

## 2021-05-01 NOTE — Patient Instructions (Addendum)
  Panic Attack A panic attack is when you suddenly feel very afraid, uncomfortable, or nervous (anxious). A panic attack can happen when you are scared or for no reason. A panic attack can feel like a serious problem. It can even feel like a heart attack or stroke. See your doctor when you have a panic attack to make sure you do not have a serious problem. Follow these instructions at home:  Take medicines only as told by your doctor.  If you feel worried or nervous, try not to have caffeine.  Take good care of your health. To do this: ? Eat healthy. Make sure to eat fresh fruits and vegetables, whole grains, lean meats, and low-fat dairy. ? Get enough sleep. Try to sleep for 7-8 hours each night. ? Exercise. Try to be active for 30 minutes 5 or more days a week. ? Do not smoke. Talk to your doctor if you need help quitting. ? Limit how much alcohol you drink:  If you are a woman who is not pregnant: try not to have more than 1 drink a day.  If you are a man: try not to have more than 2 drinks a day.  One drink equals 12 oz of beer, 5 oz of wine, or 1 oz of hard liquor.  Keep all follow-up visits as told by your doctor. This is important.   Contact a doctor if:  Your symptoms do not get better.  Your symptoms get worse.  You are not able to take your medicines as told. Get help right away if:  You have thoughts of hurting yourself or others.  You have symptoms of a panic attack. Do not drive yourself to the hospital. Have someone else drive you or call an ambulance. If you feel like you may hurt yourself or others, or have thoughts about taking your own life, get help right away. You can go to your nearest emergency department or call:  Your local emergency services (911 in the [place]  A suicide crisis helpline, such as the [facility] at 669-451-1868. This is open 24 hours a day. Summary  A panic attack is when you suddenly feel very afraid,  uncomfortable, or nervous (anxious).  See your doctor when you have a panic attack to make sure that you do not have another serious problem.  If you feel like you may hurt yourself or others, get help right away by calling 911. This information is not intended to replace advice given to you by your health care provider. Make sure you discuss any questions you have with your health care provider. Document Revised: 05/31/2020 Document Reviewed: 05/31/2020 Elsevier Patient Education  [place].

## 2021-05-02 LAB — CBC WITH DIFFERENTIAL/PLATELET
Basophils Absolute: 0 10*3/uL (ref 0.0–0.2)
Basos: 0 %
EOS (ABSOLUTE): 0.1 10*3/uL (ref 0.0–0.4)
Eos: 1 %
Hematocrit: 47.1 % (ref 37.5–51.0)
Hemoglobin: 16.1 g/dL (ref 13.0–17.7)
Immature Grans (Abs): 0 10*3/uL (ref 0.0–0.1)
Immature Granulocytes: 0 %
Lymphocytes Absolute: 1.6 10*3/uL (ref 0.7–3.1)
Lymphs: 21 %
MCH: 31.1 pg (ref 26.6–33.0)
MCHC: 34.2 g/dL (ref 31.5–35.7)
MCV: 91 fL (ref 79–97)
Monocytes Absolute: 0.6 10*3/uL (ref 0.1–0.9)
Monocytes: 8 %
Neutrophils Absolute: 5.6 10*3/uL (ref 1.4–7.0)
Neutrophils: 70 %
Platelets: 298 10*3/uL (ref 150–450)
RBC: 5.17 x10E6/uL (ref 4.14–5.80)
RDW: 12.5 % (ref 11.6–15.4)
WBC: 8 10*3/uL (ref 3.4–10.8)

## 2021-05-02 LAB — LIPID PANEL
Chol/HDL Ratio: 3.5 ratio (ref 0.0–5.0)
Cholesterol, Total: 184 mg/dL (ref 100–199)
HDL: 53 mg/dL (ref >39)
LDL Chol Calc (NIH): 104 mg/dL — ABNORMAL HIGH (ref 0–99)
Triglycerides: 155 mg/dL — ABNORMAL HIGH (ref 0–149)
VLDL Cholesterol Cal: 27 mg/dL (ref 5–40)

## 2021-05-02 LAB — CMP14+EGFR
ALT: 29 IU/L (ref 0–44)
AST: 28 IU/L (ref 0–40)
Albumin/Globulin Ratio: 2 (ref 1.2–2.2)
Albumin: 4.7 g/dL (ref 4.0–5.0)
Alkaline Phosphatase: 86 IU/L (ref 44–121)
BUN/Creatinine Ratio: 11 (ref 9–20)
BUN: 12 mg/dL (ref 6–24)
Bilirubin Total: 1.2 mg/dL (ref 0.0–1.2)
CO2: 22 mmol/L (ref 20–29)
Calcium: 9.7 mg/dL (ref 8.7–10.2)
Chloride: 99 mmol/L (ref 96–106)
Creatinine, Ser: 1.05 mg/dL (ref 0.76–1.27)
Globulin, Total: 2.3 g/dL (ref 1.5–4.5)
Glucose: 98 mg/dL (ref 65–99)
Potassium: 4.5 mmol/L (ref 3.5–5.2)
Sodium: 136 mmol/L (ref 134–144)
Total Protein: 7 g/dL (ref 6.0–8.5)
eGFR: 88 mL/min/{1.73_m2} (ref >59)

## 2021-05-02 LAB — PSA, TOTAL AND FREE
PSA, Free Pct: 15 %
PSA, Free: 0.24 ng/mL
Prostate Specific Ag, Serum: 1.6 ng/mL (ref 0.0–4.0)

## 2021-05-02 LAB — HEPATITIS C ANTIBODY: Hep C Virus Ab: <0.1 s/co ratio (ref 0.0–0.9)

## 2021-05-06 ENCOUNTER — Encounter: Payer: Self-pay | Admitting: Family Medicine

## 2021-05-06 DIAGNOSIS — F410 Panic disorder [episodic paroxysmal anxiety] without agoraphobia: Secondary | ICD-10-CM | POA: Insufficient documentation

## 2021-05-06 DIAGNOSIS — F4323 Adjustment disorder with mixed anxiety and depressed mood: Secondary | ICD-10-CM | POA: Insufficient documentation

## 2021-05-06 NOTE — Addendum Note (Signed)
Addended by: Karle Plumber on: 05/06/2021 10:54 AM   Modules accepted: Orders

## 2021-05-06 NOTE — Progress Notes (Signed)
Order will be placed.

## 2021-05-06 NOTE — Progress Notes (Signed)
Assessment & Plan:  1. Essential hypertension Well controlled on current regimen.  - CBC with Differential/Platelet - CMP14+EGFR - Lipid panel  2. Mixed hyperlipidemia Labs to assess. Previously controlled.  - CMP14+EGFR - Lipid panel  3. Situational mixed anxiety and depressive disorder Uncontrolled. Started Lexapro 10 mg daily. - escitalopram (LEXAPRO) 10 MG tablet; Take 1 tablet (10 mg total) by mouth daily.  Dispense: 30 tablet; Refill: 2  4. Panic attacks Discussed Xanax should only be used for panic attacks.  Also started patient on long-term anxiety management medication (Lexapro).  Discussed as this gets in his system the number of panic attacks he has we will decrease, but I want him to have this to use as needed for now since he is having so many.  Education provided on panic attacks. - ALPRAZolam (XANAX) 0.25 MG tablet; Take 1 tablet (0.25 mg total) by mouth daily as needed for anxiety.  Dispense: 10 tablet; Refill: 0  5. Urine troubles - PSA, total and free - Urinalysis, Routine w reflex microscopic  6. Encounter for hepatitis C screening test for low risk patient - Hepatitis C antibody  7. Immunization due - Tdap vaccine greater than or equal to 7yo IM - given in office.    Return in about 6 weeks (around 06/12/2021) for anxiety/depression/urination.  Hendricks Limes, MSN, APRN, FNP-C [facility]  Subjective:    Patient ID: Gavin Landry, male    DOB: 23-Sep-1971, 50 y.o.   MRN: [id number]  Patient Care Team: Loman Brooklyn, FNP as PCP - General (Family Medicine)   Chief Complaint:  Chief Complaint  Patient presents with  . Hypertension    3 month follow up   . Depression    Patient is going through a divorce     HPI: Gavin Landry is a 50 y.o. male presenting on 05/01/2021 for Hypertension (3 month follow up ) and Depression (Patient is going through a divorce )  Hypertension: Lisinopril was increased at his last  visit.  New complaints: Patient is having a hard time with anxiety and depression.  He is currently going through a divorce.  Four times in the past month he has had episodes that really scare him when he has symptoms of sweating, dizziness, and heart racing.  Depression screen [facility] 2/9 05/01/2021 02/01/2021 08/01/2020  Decreased Interest 1 0 0  Down, Depressed, Hopeless 3 0 0  PHQ - 2 Score 4 0 0  Altered sleeping 2 0 0  Tired, decreased energy 3 1 0  Change in appetite 0 0 0  Feeling bad or failure about yourself  1 3 0  Trouble concentrating 0 0 0  Moving slowly or fidgety/restless 1 0 0  Suicidal thoughts 2 0 0  PHQ-9 Score 13 4 0  Difficult doing work/chores Somewhat difficult Not difficult at all -   Thoughts are that he would be better off dead, not of hurting himself.  GAD 7 : Generalized Anxiety Score 05/01/2021 02/01/2021 08/01/2020  Nervous, Anxious, on Edge 3 3 0  Control/stop worrying 3 0 0  Worry too much - different things 3 0 0  Trouble relaxing 3 0 0  Restless 1 0 0  Easily annoyed or irritable 1 3 1   Afraid - awful might happen 3 0 0  Total GAD 7 Score 17 6 1   Anxiety Difficulty Somewhat difficult Not difficult at all -    Patient also reports he has been having to sit down to urinate.  He states he has a strong urge to urinate, but has to sit and then his stream is slow and weak.  Patient reports he did not receive Cologuard as it went to the house he shared with his wife and she is throwing all of his stuff away instead of giving it to him.  Social history:  Relevant past medical, surgical, family and social history reviewed and updated as indicated. Interim medical history since our last visit reviewed.  Allergies and medications reviewed and updated.  DATA REVIEWED: CHART IN EPIC  ROS: Negative unless specifically indicated above in HPI.    Current Outpatient Medications:  .  ALPRAZolam (XANAX) 0.25 MG tablet, Take 1 tablet (0.25 mg total) by mouth  daily as needed for anxiety., Disp: 10 tablet, Rfl: 0 .  atorvastatin (LIPITOR) 20 MG tablet, Take 1 tablet (20 mg total) by mouth daily., Disp: 90 tablet, Rfl: 1 .  escitalopram (LEXAPRO) 10 MG tablet, Take 1 tablet (10 mg total) by mouth daily., Disp: 30 tablet, Rfl: 2 .  lisinopril (ZESTRIL) 40 MG tablet, Take 1 tablet (40 mg total) by mouth daily., Disp: 90 tablet, Rfl: 1   Allergies  Allergen Reactions  . Iodine   . Other     IV contrast   Past Medical History:  Diagnosis Date  . Allergy   . Arthritis   . Depression   . Hyperlipidemia   . Hypertension   . Seizures ([place])     Past Surgical History:  Procedure Laterality Date  . APPENDECTOMY    . KNEE SURGERY    . VASECTOMY      Social History   Socioeconomic History  . Marital status: Single    Spouse name: Not on file  . Number of children: Not on file  . Years of education: Not on file  . Highest education level: Not on file  Occupational History  . Not on file  Tobacco Use  . Smoking status: Current Every Day Smoker    Packs/day: 0.50    Types: Cigarettes    Start date: 30  . Smokeless tobacco: Former Network engineer and Sexual Activity  . Alcohol use: Yes    Alcohol/week: 7.0 standard drinks    Types: 7 Cans of beer per week  . Drug use: No  . Sexual activity: Not on file  Other Topics Concern  . Not on file  Social History Narrative  . Not on file   Social Determinants of Health   Financial Resource Strain: Not on file  Food Insecurity: Not on file  Transportation Needs: Not on file  Physical Activity: Not on file  Stress: Not on file  Social Connections: Not on file  Intimate Partner Violence: Not on file        Objective:    BP 122/80   Pulse 85   Temp 98.4 F (36.9 C) (Temporal)   Ht 5' 10"  (1.778 m)   Wt 184 lb 3.2 oz (83.6 kg)   SpO2 95%   BMI 26.43 kg/m   Wt Readings from Last 3 Encounters:  05/01/21 184 lb 3.2 oz (83.6 kg)  02/01/21 192 lb (87.1 kg)  08/01/20 194 lb (88  kg)    Physical Exam Vitals reviewed.  Constitutional:      General: He is not in acute distress.    Appearance: Normal appearance. He is not ill-appearing, toxic-appearing or diaphoretic.  HENT:     Head: Normocephalic and atraumatic.  Eyes:     General:  No scleral icterus.       Right eye: No discharge.        Left eye: No discharge.     Conjunctiva/sclera: Conjunctivae normal.  Cardiovascular:     Rate and Rhythm: Normal rate and regular rhythm.     Heart sounds: Normal heart sounds. No murmur heard. No friction rub. No gallop.   Pulmonary:     Effort: Pulmonary effort is normal. No respiratory distress.     Breath sounds: Normal breath sounds. No stridor. No wheezing, rhonchi or rales.  Musculoskeletal:        General: Normal range of motion.     Cervical back: Normal range of motion.  Skin:    General: Skin is warm and dry.  Neurological:     Mental Status: He is alert and oriented to person, place, and time. Mental status is at baseline.  Psychiatric:        Mood and Affect: Mood normal.        Behavior: Behavior normal.        Thought Content: Thought content normal.        Judgment: Judgment normal.     Lab Results  Component Value Date   TSH 0.562 02/01/2020   Lab Results  Component Value Date   WBC 8.0 05/01/2021   HGB 16.1 05/01/2021   HCT 47.1 05/01/2021   MCV 91 05/01/2021   PLT 298 05/01/2021   Lab Results  Component Value Date   NA 136 05/01/2021   K 4.5 05/01/2021   CO2 22 05/01/2021   GLUCOSE 98 05/01/2021   BUN 12 05/01/2021   CREATININE 1.05 05/01/2021   BILITOT 1.2 05/01/2021   ALKPHOS 86 05/01/2021   AST 28 05/01/2021   ALT 29 05/01/2021   PROT 7.0 05/01/2021   ALBUMIN 4.7 05/01/2021   CALCIUM 9.7 05/01/2021   EGFR 88 05/01/2021   Lab Results  Component Value Date   CHOL 184 05/01/2021   Lab Results  Component Value Date   HDL 53 05/01/2021   Lab Results  Component Value Date   LDLCALC 104 (H) 05/01/2021   Lab Results   Component Value Date   TRIG 155 (H) 05/01/2021   Lab Results  Component Value Date   CHOLHDL 3.5 05/01/2021   No results found for: HGBA1C

## 2021-06-12 ENCOUNTER — Encounter: Payer: Self-pay | Admitting: Family Medicine

## 2021-06-12 ENCOUNTER — Ambulatory Visit (INDEPENDENT_AMBULATORY_CARE_PROVIDER_SITE_OTHER): Payer: Commercial Managed Care - PPO | Admitting: Family Medicine

## 2021-06-12 DIAGNOSIS — F410 Panic disorder [episodic paroxysmal anxiety] without agoraphobia: Secondary | ICD-10-CM

## 2021-06-12 DIAGNOSIS — F4323 Adjustment disorder with mixed anxiety and depressed mood: Secondary | ICD-10-CM

## 2021-06-12 NOTE — Progress Notes (Signed)
   Virtual Visit via Telephone Note  I connected with Gavin Landry on 06/12/21 at 8:12 AM by telephone and verified that I am speaking with the correct person using two identifiers. Gavin Landry is currently located at work and nobody is currently with him during this visit. The provider, Loman Brooklyn, FNP is located in their office at time of visit.  I discussed the limitations, risks, security and privacy concerns of performing an evaluation and management service by telephone and the availability of in person appointments. I also discussed with the patient that there may be a patient responsible charge related to this service. The patient expressed understanding and agreed to proceed.  Subjective: PCP: Loman Brooklyn, FNP  Chief Complaint  Patient presents with   Depression   Anxiety   Patient is following up on anxiety and depression. He was started on Lexapro 10 mg daily but has not started taking it as he is afraid of how he will react to it. He has taken Xanax twice due to panic attacks and states it knocked him out.    ROS: Per HPI  Current Outpatient Medications:    ALPRAZolam (XANAX) 0.25 MG tablet, Take 1 tablet (0.25 mg total) by mouth daily as needed for anxiety., Disp: 10 tablet, Rfl: 0   atorvastatin (LIPITOR) 20 MG tablet, Take 1 tablet (20 mg total) by mouth daily., Disp: 90 tablet, Rfl: 1   escitalopram (LEXAPRO) 10 MG tablet, Take 1 tablet (10 mg total) by mouth daily., Disp: 30 tablet, Rfl: 2   lisinopril (ZESTRIL) 40 MG tablet, Take 1 tablet (40 mg total) by mouth daily., Disp: 90 tablet, Rfl: 1  Allergies  Allergen Reactions   Iodine    Other     IV contrast   Past Medical History:  Diagnosis Date   Allergy    Arthritis    Depression    Hyperlipidemia    Hypertension    Seizures (HCC)     Observations/Objective: A&O  No respiratory distress or wheezing audible over the phone Mood, judgement, and thought processes all WNL   Assessment  and Plan: 1. Situational mixed anxiety and depressive disorder Encouraged to start Lexapro daily.  2. Panic attacks Has Xanax to take as needed.    Follow Up Instructions: Return in about 6 weeks (around 07/24/2021) for depression.  I discussed the assessment and treatment plan with the patient. The patient was provided an opportunity to ask questions and all were answered. The patient agreed with the plan and demonstrated an understanding of the instructions.   The patient was advised to call back or seek an in-person evaluation if the symptoms worsen or if the condition fails to improve as anticipated.  The above assessment and management plan was discussed with the patient. The patient verbalized understanding of and has agreed to the management plan. Patient is aware to call the clinic if symptoms persist or worsen. Patient is aware when to return to the clinic for a follow-up visit. Patient educated on when it is appropriate to go to the emergency department.   Time call ended: 8:23 AM  I provided 11 minutes of non-face-to-face time during this encounter.  Hendricks Limes, MSN, APRN, FNP-C [facility] 06/12/21

## 2021-07-24 ENCOUNTER — Ambulatory Visit: Payer: Commercial Managed Care - PPO | Admitting: Family Medicine

## 2021-07-24 DIAGNOSIS — F410 Panic disorder [episodic paroxysmal anxiety] without agoraphobia: Secondary | ICD-10-CM

## 2021-07-24 DIAGNOSIS — F4323 Adjustment disorder with mixed anxiety and depressed mood: Secondary | ICD-10-CM

## 2021-07-24 MED ORDER — ESCITALOPRAM OXALATE 20 MG PO TABS: 20.0000 mg | ORAL_TABLET | Freq: Every day | ORAL | 2 refills | Status: DC

## 2021-07-24 MED ORDER — ALPRAZOLAM 0.25 MG PO TABS: 0.2500 mg | ORAL_TABLET | Freq: Every day | ORAL | 0 refills | Status: DC | PRN

## 2021-07-24 MED ORDER — HYDROXYZINE HCL 10 MG PO TABS: 10.0000 mg | ORAL_TABLET | Freq: Three times a day (TID) | ORAL | 2 refills | Status: DC | PRN

## 2021-07-24 NOTE — Progress Notes (Signed)
Virtual Visit via Telephone Note  I connected with Gavin Landry on 07/24/21 at 11:21 AM by telephone and verified that I am speaking with the correct person using two identifiers. Gavin Landry is currently located at a friend's house and nobody is currently with him during this visit. The provider, Loman Brooklyn, FNP is located in their office at time of visit.  I discussed the limitations, risks, security and privacy concerns of performing an evaluation and management service by telephone and the availability of in person appointments. I also discussed with the patient that there may be a patient responsible charge related to this service. The patient expressed understanding and agreed to proceed.  Subjective: PCP: Loman Brooklyn, FNP  Chief Complaint  Patient presents with   Depression   Patient did start taking Lexapro after our last visit. He feels it is helping, but that he needs a higher dosage. He has taken Xanax as needed for panic attacks and when he can't get his mind to slow down. He was given 10 tablets three months ago and states he still has two left.   Depression screen [facility] 2/9 07/24/2021 05/01/2021 02/01/2021  Decreased Interest 2 1 0  Down, Depressed, Hopeless 1 3 0  PHQ - 2 Score 3 4 0  Altered sleeping 0 2 0  Tired, decreased energy 0 3 1  Change in appetite 1 0 0  Feeling bad or failure about yourself  '3 1 3  '$ Trouble concentrating 2 0 0  Moving slowly or fidgety/restless 0 1 0  Suicidal thoughts 0 2 0  PHQ-9 Score '9 13 4  '$ Difficult doing work/chores Somewhat difficult Somewhat difficult Not difficult at all   GAD 7 : Generalized Anxiety Score 07/24/2021 05/01/2021 02/01/2021 08/01/2020  Nervous, Anxious, on Edge '3 3 3 '$ 0  Control/stop worrying 3 3 0 0  Worry too much - different things 3 3 0 0  Trouble relaxing 2 3 0 0  Restless 3 1 0 0  Easily annoyed or irritable '3 1 3 1  '$ Afraid - awful might happen 3 3 0 0  Total GAD 7 Score '20 17 6 1  '$ Anxiety  Difficulty Very difficult Somewhat difficult Not difficult at all -    ROS: Per HPI  Current Outpatient Medications:    ALPRAZolam (XANAX) 0.25 MG tablet, Take 1 tablet (0.25 mg total) by mouth daily as needed for anxiety., Disp: 10 tablet, Rfl: 0   atorvastatin (LIPITOR) 20 MG tablet, Take 1 tablet (20 mg total) by mouth daily., Disp: 90 tablet, Rfl: 1   escitalopram (LEXAPRO) 10 MG tablet, Take 1 tablet (10 mg total) by mouth daily., Disp: 30 tablet, Rfl: 2   lisinopril (ZESTRIL) 40 MG tablet, Take 1 tablet (40 mg total) by mouth daily., Disp: 90 tablet, Rfl: 1  Allergies  Allergen Reactions   Iodine    Other     IV contrast   Past Medical History:  Diagnosis Date   Allergy    Arthritis    Depression    Hyperlipidemia    Hypertension    Seizures (HCC)     Observations/Objective: A&O  No respiratory distress or wheezing audible over the phone Mood, judgement, and thought processes all WNL   Assessment and Plan: 1. Situational mixed anxiety and depressive disorder Improving. Lexapro increased from 10 mg to 20 mg. Rx'd Atarax to take as needed when he feels extra stressed or like his mind is racing. Advised against using Xanax at  this time and to only take for panic attacks. - escitalopram (LEXAPRO) 20 MG tablet; Take 1 tablet (20 mg total) by mouth daily.  Dispense: 30 tablet; Refill: 2 - hydrOXYzine (ATARAX/VISTARIL) 10 MG tablet; Take 1 tablet (10 mg total) by mouth 3 (three) times daily as needed for anxiety.  Dispense: 30 tablet; Refill: 2  2. Panic attacks Resolve with Xanax PRN. - ALPRAZolam (XANAX) 0.25 MG tablet; Take 1 tablet (0.25 mg total) by mouth daily as needed for anxiety.  Dispense: 10 tablet; Refill: 0   Follow Up Instructions: Return in about 6 weeks (around 09/04/2021) for Anxiety (telephone).  I discussed the assessment and treatment plan with the patient. The patient was provided an opportunity to ask questions and all were answered. The patient  agreed with the plan and demonstrated an understanding of the instructions.   The patient was advised to call back or seek an in-person evaluation if the symptoms worsen or if the condition fails to improve as anticipated.  The above assessment and management plan was discussed with the patient. The patient verbalized understanding of and has agreed to the management plan. Patient is aware to call the clinic if symptoms persist or worsen. Patient is aware when to return to the clinic for a follow-up visit. Patient educated on when it is appropriate to go to the emergency department.   Time call ended: 11:32 AM  I provided 11 minutes of non-face-to-face time during this encounter.  Hendricks Limes, MSN, APRN, FNP-C [facility] 07/24/21

## 2021-10-24 ENCOUNTER — Other Ambulatory Visit: Payer: Self-pay | Admitting: Family Medicine

## 2021-10-24 DIAGNOSIS — E782 Mixed hyperlipidemia: Secondary | ICD-10-CM

## 2021-10-24 DIAGNOSIS — F410 Panic disorder [episodic paroxysmal anxiety] without agoraphobia: Secondary | ICD-10-CM

## 2022-01-24 ENCOUNTER — Other Ambulatory Visit: Payer: Self-pay

## 2022-01-24 ENCOUNTER — Ambulatory Visit: Payer: Commercial Managed Care - PPO | Admitting: Family Medicine

## 2022-01-24 ENCOUNTER — Ambulatory Visit (HOSPITAL_COMMUNITY)
Admission: RE | Admit: 2022-01-24 | Discharge: 2022-01-24 | Disposition: A | Discharge status: Home / Self Care | Payer: Commercial Managed Care - PPO | Source: Ambulatory Visit | Attending: Family Medicine | Admitting: Family Medicine

## 2022-01-24 VITALS — BP 168/113 | HR 99 | Ht 70.0 in | Wt 183.0 lb

## 2022-01-24 DIAGNOSIS — R1032 Left lower quadrant pain: Secondary | ICD-10-CM | POA: Diagnosis present

## 2022-01-24 DIAGNOSIS — R140 Abdominal distension (gaseous): Secondary | ICD-10-CM | POA: Diagnosis not present

## 2022-01-24 DIAGNOSIS — R1030 Lower abdominal pain, unspecified: Secondary | ICD-10-CM | POA: Diagnosis not present

## 2022-01-24 LAB — CBC WITH DIFFERENTIAL/PLATELET
Basophils Absolute: 0 10*3/uL (ref 0.0–0.2)
Basos: 0 %
EOS (ABSOLUTE): 0.1 10*3/uL (ref 0.0–0.4)
Eos: 1 %
Hematocrit: 42.1 % (ref 37.5–51.0)
Hemoglobin: 14.3 g/dL (ref 13.0–17.7)
Immature Grans (Abs): 0 10*3/uL (ref 0.0–0.1)
Immature Granulocytes: 0 %
Lymphocytes Absolute: 1.7 10*3/uL (ref 0.7–3.1)
Lymphs: 17 %
MCH: 30.8 pg (ref 26.6–33.0)
MCHC: 34 g/dL (ref 31.5–35.7)
MCV: 91 fL (ref 79–97)
Monocytes Absolute: 0.7 10*3/uL (ref 0.1–0.9)
Monocytes: 6 %
Neutrophils Absolute: 7.6 10*3/uL — ABNORMAL HIGH (ref 1.4–7.0)
Neutrophils: 76 %
Platelets: 267 10*3/uL (ref 150–450)
RBC: 4.64 x10E6/uL (ref 4.14–5.80)
RDW: 12.1 % (ref 11.6–15.4)
WBC: 10.1 10*3/uL (ref 3.4–10.8)

## 2022-01-24 NOTE — Addendum Note (Signed)
Addended by: Caryl Pina on: 01/24/2022 03:13 PM   Modules accepted: Orders

## 2022-01-24 NOTE — Progress Notes (Signed)
Vitals:   01/24/22 1334  Weight: 183 lb (83 kg)  Height: 5\' 10"  (1.778 m)    I connected with Joycelyn Rua on 01/24/22 at 1253 by telephone and verified that I am speaking with the correct person using two identifiers. JANIE STROTHMAN is currently located at home and patient are currently with her during visit. The provider, Fransisca Kaufmann Genessa Beman, MD is located in their office at time of visit.  Call ended at 1259  I discussed the limitations, risks, security and privacy concerns of performing an evaluation and management service by telephone and the availability of in person appointments. I also discussed with the patient that there may be a patient responsible charge related to this service. The patient expressed understanding and agreed to proceed.   History and Present Illness: Patient is calling in for possible hernia.  He was lifting weights regularly.  He bent over to pick up a tarp and felt a lot of pain in his groin.  Coughing and rolling is painful.  He took ibuprofen and didn't help much.  He took 5 and finally sleep. It hurts in the bottom half of his stomach as well.  The pain has been very severe and significant he has trouble even getting up from sitting to standing.  Visit changed to in person visit in patient instructed to come in and be seen  Physical Exam Vitals and nursing note reviewed.  Neck:     Thyroid: No thyromegaly.  Abdominal:     General: Abdomen is flat. Bowel sounds are normal. There is no distension.     Tenderness: There is abdominal tenderness in the suprapubic area. There is guarding. There is no right CVA tenderness, left CVA tenderness or rebound.  Skin:    General: Skin is warm and dry.     Findings: No rash.  Neurological:     Mental Status: He is oriented to person, place, and time.     Coordination: Coordination normal.  Psychiatric:        Behavior: Behavior normal.     1. Lower abdominal pain   2. Abdominal distention   3. Left lower  quadrant abdominal pain     Outpatient Encounter Medications as of 01/24/2022  Medication Sig   [DISCONTINUED] ALPRAZolam (XANAX) 0.25 MG tablet Take 1 tablet (0.25 mg total) by mouth daily as needed for anxiety.   [DISCONTINUED] atorvastatin (LIPITOR) 20 MG tablet Take 1 tablet (20 mg total) by mouth daily. (NEEDS TO BE SEEN BEFORE NEXT REFILL)   [DISCONTINUED] escitalopram (LEXAPRO) 20 MG tablet Take 1 tablet (20 mg total) by mouth daily.   [DISCONTINUED] hydrOXYzine (ATARAX/VISTARIL) 10 MG tablet Take 1 tablet (10 mg total) by mouth 3 (three) times daily as needed for anxiety.   [DISCONTINUED] lisinopril (ZESTRIL) 40 MG tablet Take 1 tablet (40 mg total) by mouth daily.   No facility-administered encounter medications on file as of 01/24/2022.    Review of Systems  Constitutional:  Negative for chills and fever.  Respiratory:  Negative for shortness of breath and wheezing.   Cardiovascular:  Negative for chest pain and leg swelling.  Gastrointestinal:  Positive for abdominal pain. Negative for blood in stool, constipation, diarrhea, nausea and vomiting.  Musculoskeletal:  Negative for arthralgias, back pain and gait problem.  Skin:  Negative for rash.  All other systems reviewed and are negative.    Assessment and Plan: Problem List Items Addressed This Visit   None Visit Diagnoses  Lower abdominal pain    -  Primary   Relevant Orders   CT Abdomen Pelvis W Contrast   Abdominal distention       Relevant Orders   CT Abdomen Pelvis W Contrast   Left lower quadrant abdominal pain       Relevant Orders   CT Abdomen Pelvis W Contrast       Will order CT abdomen pelvis, concern for possible obstruction versus diverticulitis Follow up plan: Return if symptoms worsen or fail to improve.     I discussed the assessment and treatment plan with the patient. The patient was provided an opportunity to ask questions and all were answered. The patient agreed with the plan and  demonstrated an understanding of the instructions.   The patient was advised to call back or seek an in-person evaluation if the symptoms worsen or if the condition fails to improve as anticipated.  The above assessment and management plan was discussed with the patient. The patient verbalized understanding of and has agreed to the management plan. Patient is aware to call the clinic if symptoms persist or worsen. Patient is aware when to return to the clinic for a follow-up visit. Patient educated on when it is appropriate to go to the emergency department.    Worthy Rancher, MD

## 2022-01-24 NOTE — Addendum Note (Signed)
Addended by: Alphonzo Dublin on: 01/24/2022 02:48 PM   Modules accepted: Orders

## 2022-01-27 ENCOUNTER — Other Ambulatory Visit: Payer: Self-pay | Admitting: Family Medicine

## 2022-01-27 DIAGNOSIS — K5792 Diverticulitis of intestine, part unspecified, without perforation or abscess without bleeding: Secondary | ICD-10-CM

## 2022-01-27 MED ORDER — CIPROFLOXACIN HCL 500 MG PO TABS: 500.0000 mg | ORAL_TABLET | Freq: Two times a day (BID) | ORAL | 0 refills | Status: DC

## 2022-01-27 MED ORDER — METRONIDAZOLE 500 MG PO TABS: 500.0000 mg | ORAL_TABLET | Freq: Two times a day (BID) | ORAL | 0 refills | Status: DC

## 2022-01-27 NOTE — Progress Notes (Signed)
Lmtcb.

## 2022-01-27 NOTE — Progress Notes (Signed)
Please let the patient know that his CT scan showed diverticulitis on Friday and sent in 2 antibiotics for him to take for the diverticulitis. Caryl Pina, MD [facility] 01/27/2022, 9:15 AM

## 2022-01-28 ENCOUNTER — Telehealth: Payer: Self-pay | Admitting: Family Medicine

## 2022-01-28 NOTE — Telephone Encounter (Signed)
Pt is aware of labs and CT scan and voiced understanding.

## 2022-02-10 NOTE — Progress Notes (Signed)
If he is no better after finishing both antibiotics then he needs to come in and be seen again.

## 2022-02-10 NOTE — Progress Notes (Signed)
Patient is aware of results and taking antibiotics.  He has one dose of each antibiotic left but is still experiencing abdominal pain.  What do you recommend?

## 2022-02-11 NOTE — Progress Notes (Signed)
Spoke with pt. He finished ATB today. He continues to have abd pain and diarrhea. Per Appt scheduled with PCP 2/29 at 4:05. Per [place] pt needs to be evaluated again.

## 2022-02-12 ENCOUNTER — Encounter: Payer: Self-pay | Admitting: Family Medicine

## 2022-02-12 ENCOUNTER — Ambulatory Visit (INDEPENDENT_AMBULATORY_CARE_PROVIDER_SITE_OTHER): Payer: Commercial Managed Care - PPO | Admitting: Family Medicine

## 2022-02-12 VITALS — BP 146/96 | HR 91 | Temp 98.1°F | Ht 70.0 in | Wt 184.4 lb

## 2022-02-12 DIAGNOSIS — K5792 Diverticulitis of intestine, part unspecified, without perforation or abscess without bleeding: Secondary | ICD-10-CM | POA: Diagnosis not present

## 2022-02-12 MED ORDER — AMOXICILLIN-POT CLAVULANATE 875-125 MG PO TABS: 1.0000 | ORAL_TABLET | Freq: Two times a day (BID) | ORAL | 0 refills | Status: AC

## 2022-02-12 NOTE — Progress Notes (Signed)
? ?Assessment & Plan:  ?1. Diverticulitis ?Education provided on diverticulitis. ?- amoxicillin-clavulanate (AUGMENTIN) 875-125 MG tablet; Take 1 tablet by mouth 2 (two) times daily for 10 days.  Dispense: 20 tablet; Refill: 0 ?- Ambulatory referral to Gastroenterology ? ? ?Follow up plan: Return in near future, for follow-up of chronic medication conditions. ? ?Hendricks Limes, MSN, APRN, FNP-C ?[place] ? ?Subjective:  ? ?Patient ID: Gavin Landry, male    DOB: 1971/11/06, 51 y.o.   MRN: [id number] ? ?HPI: ?Gavin Landry is a 51 y.o. male presenting on 02/12/2022 for Diverticulitis (Patient states he is still having abd pain.  Seen [facility] on 2/10 and states it is better but still having pain. ) ? ?Patient was seen on 01/24/2022 for left lower abdominal pain. He had a CT abdomen pelvis without contrast was diagnosed with diverticulitis and treated with Cipro and Flagyl, which he completed as ordered. He was feeling better but states the pain returned.  He describes it as severe and intermittent.  He states when the pain is really bad he feels like he has to have a bowel movement, but cannot.  As soon as the pain resolves he is able to have a normal bowel movement.  Denies constipation. ? ? ?ROS: Negative unless specifically indicated above in HPI.  ? ?Relevant past medical history reviewed and updated as indicated.  ? ?Allergies and medications reviewed and updated. ? ? ?Current Outpatient Medications:  ?  atorvastatin (LIPITOR) 20 MG tablet, Take 20 mg by mouth daily., Disp: , Rfl:  ? ?Allergies  ?Allergen Reactions  ? Iodine   ? Other   ?  IV contrast  ? ? ?Objective:  ? ?BP (!) 146/96   Pulse 91   Temp 98.1 ?F (36.7 ?C) (Temporal)   Ht 5\' 10"  (1.778 m)   Wt 184 lb 6.4 oz (83.6 kg)   SpO2 93%   BMI 26.46 kg/m?   ? ?Physical Exam ?Vitals reviewed.  ?Constitutional:   ?   General: He is not in acute distress. ?   Appearance: Normal appearance. He is not ill-appearing,  toxic-appearing or diaphoretic.  ?HENT:  ?   Head: Normocephalic and atraumatic.  ?Eyes:  ?   General: No scleral icterus.    ?   Right eye: No discharge.     ?   Left eye: No discharge.  ?   Conjunctiva/sclera: Conjunctivae normal.  ?Cardiovascular:  ?   Rate and Rhythm: Normal rate and regular rhythm.  ?   Heart sounds: Normal heart sounds. No murmur heard. ?  No friction rub. No gallop.  ?Pulmonary:  ?   Effort: Pulmonary effort is normal. No respiratory distress.  ?   Breath sounds: Normal breath sounds. No stridor. No wheezing, rhonchi or rales.  ?Abdominal:  ?   General: Abdomen is flat. Bowel sounds are normal. There is no distension or abdominal bruit. There are no signs of injury.  ?   Palpations: Abdomen is soft. There is no shifting dullness, fluid wave, hepatomegaly, splenomegaly, mass or pulsatile mass.  ?   Tenderness: There is abdominal tenderness in the left upper quadrant and left lower quadrant.  ?Musculoskeletal:     ?   General: Normal range of motion.  ?   Cervical back: Normal range of motion.  ?Skin: ?   General: Skin is warm and dry.  ?Neurological:  ?   Mental Status: He is alert and oriented to person, place, and time. Mental status is at  baseline.  ?Psychiatric:     ?   Mood and Affect: Mood normal.     ?   Behavior: Behavior normal.     ?   Thought Content: Thought content normal.     ?   Judgment: Judgment normal.  ? ? ? ? ? ? ?

## 2022-02-19 ENCOUNTER — Encounter: Payer: Self-pay | Admitting: Gastroenterology

## 2022-02-28 ENCOUNTER — Ambulatory Visit: Payer: Commercial Managed Care - PPO | Admitting: Family Medicine

## 2022-02-28 ENCOUNTER — Encounter: Payer: Self-pay | Admitting: Family Medicine

## 2022-02-28 VITALS — BP 167/102 | HR 101 | Temp 98.3°F | Ht 70.0 in | Wt 185.6 lb

## 2022-02-28 DIAGNOSIS — Z0000 Encounter for general adult medical examination without abnormal findings: Secondary | ICD-10-CM

## 2022-02-28 DIAGNOSIS — I10 Essential (primary) hypertension: Secondary | ICD-10-CM

## 2022-02-28 DIAGNOSIS — F4323 Adjustment disorder with mixed anxiety and depressed mood: Secondary | ICD-10-CM

## 2022-02-28 DIAGNOSIS — E782 Mixed hyperlipidemia: Secondary | ICD-10-CM | POA: Diagnosis not present

## 2022-02-28 MED ORDER — ESCITALOPRAM OXALATE 10 MG PO TABS: 10.0000 mg | ORAL_TABLET | Freq: Every day | ORAL | 1 refills | Status: DC

## 2022-02-28 MED ORDER — LISINOPRIL 40 MG PO TABS: 40.0000 mg | ORAL_TABLET | Freq: Every day | ORAL | 1 refills | Status: DC

## 2022-02-28 MED ORDER — ATORVASTATIN CALCIUM 20 MG PO TABS: 20.0000 mg | ORAL_TABLET | Freq: Every day | ORAL | 1 refills | Status: DC

## 2022-02-28 NOTE — Progress Notes (Signed)
? ?Assessment & Plan:  ?1. Essential hypertension ?Uncontrolled without medication. Resuming Lisinopril 40 mg daily. ?- lisinopril (ZESTRIL) 40 MG tablet; Take 1 tablet (40 mg total) by mouth daily.  Dispense: 90 tablet; Refill: 1 ?- CBC with Differential/Platelet ?- CMP14+EGFR ?- Lipid panel ? ?2. Mixed hyperlipidemia ?Labs to assess. ?- atorvastatin (LIPITOR) 20 MG tablet; Take 1 tablet (20 mg total) by mouth daily.  Dispense: 90 tablet; Refill: 1 ?- CBC with Differential/Platelet ?- CMP14+EGFR ?- Lipid panel ? ?3. Situational mixed anxiety and depressive disorder ?Uncontrolled without medication. Resuming Lexapro. ?- escitalopram (LEXAPRO) 10 MG tablet; Take 1 tablet (10 mg total) by mouth daily.  Dispense: 90 tablet; Refill: 1 ? ?4. Healthcare maintenance ?Encouraged to complete Cologuard. ? ? ?Return in about 6 weeks (around 04/11/2022) for HTN. ? ?Hendricks Limes, MSN, APRN, FNP-C ?[name] ? ?Subjective:  ? ? Patient ID: Gavin Landry, male    DOB: November 01, 1971, 51 y.o.   MRN: [id number] ? ?Patient Care Team: ?Loman Brooklyn, FNP as PCP - General (Family Medicine)  ? ?Chief Complaint:  ?Chief Complaint  ?Patient presents with  ? Medical Management of Chronic Issues  ? ? ?HPI: ?Gavin Landry is a 51 y.o. male presenting on 02/28/2022 for Medical Management of Chronic Issues ? ?Hypertension: patient has not been taking Lisinopril. It is unclear why his prescription was stopped. ? ?Hyperlipidemia: taking atorvastatin daily. ? ?Anxiety/Depression: has not been taking Lexapro. He would like to resume it.  ? ?Depression screen [facility] 2/9 02/28/2022 07/24/2021 05/01/2021  ?Decreased Interest _0 ?Down, Depressed, Hopeless _1 ?PHQ - 2 Score _2 ?Altered sleeping 0 0 2  ?Tired, decreased energy 3 0 3  ?Change in appetite 0 1 0  ?Feeling bad or failure about yourself  _3 ?Trouble concentrating 0 2 0  ?Moving slowly or fidgety/restless 2 0 1  ?Suicidal thoughts 0 0 2  ?PHQ-9 Score _4 ?Difficult doing work/chores Very difficult Somewhat difficult Somewhat difficult  ?Some recent data might be hidden  ? ?GAD 7 : Generalized Anxiety Score 02/28/2022 07/24/2021 05/01/2021 02/01/2021  ?Nervous, Anxious, on Edge _5 ?Control/stop worrying _6 0  ?Worry too much - different things _7 0  ?Trouble relaxing _8 0  ?Restless _9 0  ?Easily annoyed or irritable _10 ?Afraid - awful might happen _11 0  ?Total GAD 7 Score _12 ?Anxiety Difficulty Somewhat difficult Very difficult Somewhat difficult Not difficult at all  ? ? ?New complaints: ?None ? ? ?Social history: ? ?Relevant past medical, surgical, family and social history reviewed and updated as indicated. Interim medical history since our last visit reviewed. ? ?Allergies and medications reviewed and updated. ? ?DATA REVIEWED: CHART IN EPIC ? ?ROS: Negative unless specifically indicated above in HPI.  ? ? ?Current Outpatient Medications:  ?  atorvastatin (LIPITOR) 20 MG tablet, Take 20 mg by mouth daily., Disp: , Rfl:   ? ?Allergies  ?Allergen Reactions  ? Iodine   ? Other   ?  IV contrast  ? ?Past Medical History:  ?Diagnosis Date  ? Allergy   ? Arthritis   ? Depression   ? Hyperlipidemia   ? Hypertension   ? Seizures ([place])   ?  ?Past Surgical History:  ?Procedure Laterality Date  ? APPENDECTOMY    ?  KNEE SURGERY    ? VASECTOMY    ?  ?Social History  ? ?Socioeconomic History  ? Marital status: Single  ?  Spouse name: Not on file  ? Number of children: Not on file  ? Years of education: Not on file  ? Highest education level: Not on file  ?Occupational History  ? Not on file  ?Tobacco Use  ? Smoking status: Every Day  ?  Packs/day: 0.50  ?  Types: Cigarettes  ?  Start date: 4  ? Smokeless tobacco: Former  ?Substance and Sexual Activity  ? Alcohol use: Yes  ?  Alcohol/week: 7.0 standard drinks  ?  Types: 7 Cans of beer per week  ? Drug use: No  ? Sexual activity: Not on file  ?Other Topics Concern  ? Not on file  ?Social  History Narrative  ? Not on file  ? ?Social Determinants of Health  ? ?Financial Resource Strain: Not on file  ?Food Insecurity: Not on file  ?Transportation Needs: Not on file  ?Physical Activity: Not on file  ?Stress: Not on file  ?Social Connections: Not on file  ?Intimate Partner Violence: Not on file  ?  ? ?   ?Objective:  ?  ?BP (!) 167/102   Pulse (!) 101   Temp 98.3 ?F (36.8 ?C) (Temporal)   Ht _0  (1.778 m)   Wt 185 lb 9.6 oz (84.2 kg)   SpO2 95%   BMI 26.63 kg/m?  ? ?Wt Readings from Last 3 Encounters:  ?02/28/22 185 lb 9.6 oz (84.2 kg)  ?02/12/22 184 lb 6.4 oz (83.6 kg)  ?01/24/22 183 lb (83 kg)  ? ? ?Physical Exam ?Vitals reviewed.  ?Constitutional:   ?   General: He is not in acute distress. ?   Appearance: Normal appearance. He is not ill-appearing, toxic-appearing or diaphoretic.  ?HENT:  ?   Head: Normocephalic and atraumatic.  ?Eyes:  ?   General: No scleral icterus.    ?   Right eye: No discharge.     ?   Left eye: No discharge.  ?   Conjunctiva/sclera: Conjunctivae normal.  ?Cardiovascular:  ?   Rate and Rhythm: Normal rate and regular rhythm.  ?   Heart sounds: Normal heart sounds. No murmur heard. ?  No friction rub. No gallop.  ?Pulmonary:  ?   Effort: Pulmonary effort is normal. No respiratory distress.  ?   Breath sounds: Normal breath sounds. No stridor. No wheezing, rhonchi or rales.  ?Musculoskeletal:     ?   General: Normal range of motion.  ?   Cervical back: Normal range of motion.  ?Skin: ?   General: Skin is warm and dry.  ?Neurological:  ?   Mental Status: He is alert and oriented to person, place, and time. Mental status is at baseline.  ?Psychiatric:     ?   Mood and Affect: Mood normal.     ?   Behavior: Behavior normal.     ?   Thought Content: Thought content normal.     ?   Judgment: Judgment normal.  ? ? ?Lab Results  ?Component Value Date  ? TSH 0.562 02/01/2020  ? ?Lab Results  ?Component Value Date  ? WBC 10.1 01/24/2022  ? HGB 14.3 01/24/2022  ? HCT 42.1  01/24/2022  ? MCV 91 01/24/2022  ? PLT 267 01/24/2022  ? ?Lab Results  ?Component Value Date  ? NA 136 05/01/2021  ? K 4.5 05/01/2021  ?  CO2 22 05/01/2021  ? GLUCOSE 98 05/01/2021  ? BUN 12 05/01/2021  ? CREATININE 1.05 05/01/2021  ? BILITOT 1.2 05/01/2021  ? ALKPHOS 86 05/01/2021  ? AST 28 05/01/2021  ? ALT 29 05/01/2021  ? PROT 7.0 05/01/2021  ? ALBUMIN 4.7 05/01/2021  ? CALCIUM 9.7 05/01/2021  ? EGFR 88 05/01/2021  ? ?Lab Results  ?Component Value Date  ? CHOL 184 05/01/2021  ? ?Lab Results  ?Component Value Date  ? HDL 53 05/01/2021  ? ?Lab Results  ?Component Value Date  ? LDLCALC 104 (H) 05/01/2021  ? ?Lab Results  ?Component Value Date  ? TRIG 155 (H) 05/01/2021  ? ?Lab Results  ?Component Value Date  ? CHOLHDL 3.5 05/01/2021  ? ?No results found for: HGBA1C ? ?   ? ? ? ? ?

## 2022-03-01 LAB — LIPID PANEL
Chol/HDL Ratio: 4.3 ratio (ref 0.0–5.0)
Cholesterol, Total: 221 mg/dL — ABNORMAL HIGH (ref 100–199)
HDL: 52 mg/dL (ref >39)
LDL Chol Calc (NIH): 132 mg/dL — ABNORMAL HIGH (ref 0–99)
Triglycerides: 211 mg/dL — ABNORMAL HIGH (ref 0–149)
VLDL Cholesterol Cal: 37 mg/dL (ref 5–40)

## 2022-03-01 LAB — CMP14+EGFR
ALT: 34 IU/L (ref 0–44)
AST: 31 IU/L (ref 0–40)
Albumin/Globulin Ratio: 1.6 (ref 1.2–2.2)
Albumin: 4.6 g/dL (ref 4.0–5.0)
Alkaline Phosphatase: 84 IU/L (ref 44–121)
BUN/Creatinine Ratio: 17 (ref 9–20)
BUN: 15 mg/dL (ref 6–24)
Bilirubin Total: 0.5 mg/dL (ref 0.0–1.2)
CO2: 25 mmol/L (ref 20–29)
Calcium: 9.6 mg/dL (ref 8.7–10.2)
Chloride: 103 mmol/L (ref 96–106)
Creatinine, Ser: 0.9 mg/dL (ref 0.76–1.27)
Globulin, Total: 2.8 g/dL (ref 1.5–4.5)
Glucose: 85 mg/dL (ref 70–99)
Potassium: 4.3 mmol/L (ref 3.5–5.2)
Sodium: 141 mmol/L (ref 134–144)
Total Protein: 7.4 g/dL (ref 6.0–8.5)
eGFR: 105 mL/min/{1.73_m2} (ref >59)

## 2022-03-01 LAB — CBC WITH DIFFERENTIAL/PLATELET
Basophils Absolute: 0 10*3/uL (ref 0.0–0.2)
Basos: 0 %
EOS (ABSOLUTE): 0.1 10*3/uL (ref 0.0–0.4)
Eos: 1 %
Hematocrit: 44.4 % (ref 37.5–51.0)
Hemoglobin: 15.1 g/dL (ref 13.0–17.7)
Immature Grans (Abs): 0 10*3/uL (ref 0.0–0.1)
Immature Granulocytes: 0 %
Lymphocytes Absolute: 2.3 10*3/uL (ref 0.7–3.1)
Lymphs: 30 %
MCH: 30.3 pg (ref 26.6–33.0)
MCHC: 34 g/dL (ref 31.5–35.7)
MCV: 89 fL (ref 79–97)
Monocytes Absolute: 0.7 10*3/uL (ref 0.1–0.9)
Monocytes: 10 %
Neutrophils Absolute: 4.5 10*3/uL (ref 1.4–7.0)
Neutrophils: 59 %
Platelets: 251 10*3/uL (ref 150–450)
RBC: 4.99 x10E6/uL (ref 4.14–5.80)
RDW: 12.5 % (ref 11.6–15.4)
WBC: 7.7 10*3/uL (ref 3.4–10.8)

## 2022-03-11 ENCOUNTER — Ambulatory Visit: Payer: Commercial Managed Care - PPO | Admitting: Gastroenterology

## 2022-04-01 ENCOUNTER — Ambulatory Visit: Payer: Commercial Managed Care - PPO | Admitting: Gastroenterology

## 2022-04-02 ENCOUNTER — Ambulatory Visit (INDEPENDENT_AMBULATORY_CARE_PROVIDER_SITE_OTHER): Payer: Commercial Managed Care - PPO | Admitting: Gastroenterology

## 2022-04-02 ENCOUNTER — Encounter: Payer: Self-pay | Admitting: Gastroenterology

## 2022-04-02 VITALS — BP 140/88 | HR 111 | Ht 70.0 in | Wt 182.1 lb

## 2022-04-02 DIAGNOSIS — K5792 Diverticulitis of intestine, part unspecified, without perforation or abscess without bleeding: Secondary | ICD-10-CM

## 2022-04-02 DIAGNOSIS — F17200 Nicotine dependence, unspecified, uncomplicated: Secondary | ICD-10-CM | POA: Diagnosis not present

## 2022-04-02 MED ORDER — SUTAB 1479-225-188 MG PO TABS: 1.0000 | ORAL_TABLET | Freq: Once | ORAL | 0 refills | Status: AC

## 2022-04-02 NOTE — Patient Instructions (Signed)
If you are age 51 or older, your body mass index should be between 23-30. Your Body mass index is 26.12 kg/m?Marland Kitchen If this is out of the aforementioned range listed, please consider follow up with your Primary Care Provider. ? ?If you are age 25 or younger, your body mass index should be between 19-25. Your Body mass index is 26.12 kg/m?Marland Kitchen If this is out of the aformentioned range listed, please consider follow up with your Primary Care Provider.  ? ?You have been scheduled for a colonoscopy. Please follow written instructions given to you at your visit today.  ?Please pick up your prep supplies at the pharmacy within the next 1-3 days. ?If you use inhalers (even only as needed), please bring them with you on the day of your procedure. ? ?The [facility] providers would like to encourage you to use [facility] to communicate with providers for non-urgent requests or questions.  Due to long hold times on the telephone, sending your provider a message by [facility] may be a faster and more efficient way to get a response.  Please allow 48 business hours for a response.  Please remember that this is for non-urgent requests.  ? ?It was a pleasure to see you today! ? ?Thank you for trusting me with your gastrointestinal care!   ? ?Scott E.Candis Schatz, MD  ? ?

## 2022-04-02 NOTE — Progress Notes (Signed)
? ?HPI : Gavin Landry is a very pleasant 51 year old male with hypertension and chronic tobacco use who was referred to our office by Delight Ovens, FNP for follow-up of uncomplicated diverticulitis.  Patient states he started having left lower quadrant pain in February which was initially mild, but got progressively worse over the course of a week.  A CT scan was done which showed uncomplicated diverticulitis near the sigmoid/descending colon junction.  He was treated with antibiotics.  His pain improved but did not completely resolve, and then he experienced an acute worsening of his pain a couple weeks later and was treated with a different course of antibiotics.  A repeat CT scan was not done.  His his pain completely resolved after this course of antibiotics. ?Currently, he denies any GI symptoms.  No abdominal pain.  He has regular formed brown bowel movements every day.  No problems with constipation or diarrhea.  No blood in the stool.  No family history of colon cancer. ?He has been taking a fiber supplement daily since his second attack of diverticulitis. ?He has never had a colonoscopy. ? ?Past Medical History:  ?Diagnosis Date  ? Allergy   ? Anxiety   ? Arthritis   ? Depression   ? Hyperlipidemia   ? Hypertension   ? Seizures ([place])   ? ? ? ?Past Surgical History:  ?Procedure Laterality Date  ? APPENDECTOMY    ? KNEE SURGERY Left   ? VASECTOMY    ? ?Family History  ?Problem Relation Age of Onset  ? Depression Mother   ? Diabetes Mother   ? Heart disease Mother   ? Hypertension Mother   ? Heart attack Mother   ? ?Social History  ? ?Tobacco Use  ? Smoking status: Every Day  ?  Packs/day: 0.50  ?  Types: Cigarettes  ?  Start date: 78  ? Smokeless tobacco: Former  ?Substance Use Topics  ? Alcohol use: Yes  ?  Alcohol/week: 7.0 standard drinks  ?  Types: 7 Cans of beer per week  ? Drug use: No  ? ?Current Outpatient Medications  ?Medication Sig Dispense Refill  ? atorvastatin (LIPITOR) 20 MG tablet Take 1  tablet (20 mg total) by mouth daily. 90 tablet 1  ? escitalopram (LEXAPRO) 10 MG tablet Take 1 tablet (10 mg total) by mouth daily. (Patient taking differently: Take 10 mg by mouth as needed.) 90 tablet 1  ? lisinopril (ZESTRIL) 40 MG tablet Take 1 tablet (40 mg total) by mouth daily. 90 tablet 1  ? ?No current facility-administered medications for this visit.  ? ?Allergies  ?Allergen Reactions  ? Iodine   ? Other   ?  IV contrast  ? ? ? ?Review of Systems: ?All systems reviewed and negative except where noted in HPI.  ? ? ?No results found. ? ?Physical Exam: ?Ht '5\' 10"'$  (1.778 m)   Wt 182 lb 1 oz (82.6 kg)   BMI 26.12 kg/m?  ?Constitutional: Pleasant,well-developed, Caucasian male in no acute distress. ?HEENT: Normocephalic and atraumatic. Conjunctivae are normal. No scleral icterus.  Missing teeth.  Mallampati 2 ?Neck supple.  ?Cardiovascular: Normal rate, regular rhythm.  ?Pulmonary/chest: Effort normal and breath sounds normal. No wheezing, rales or rhonchi. ?Abdominal: Soft, nondistended, nontender. Bowel sounds active throughout. There are no masses palpable. No hepatomegaly. ?Extremities: no edema ?Neurological: Alert and oriented to person place and time. ?Skin: Skin is warm and dry. No rashes noted. ?Psychiatric: Normal mood and affect. Behavior is normal. ? ?CBC ?   ?  Component Value Date/Time  ? WBC 7.7 02/28/2022 1558  ? RBC 4.99 02/28/2022 1558  ? HGB 15.1 02/28/2022 1558  ? HCT 44.4 02/28/2022 1558  ? PLT 251 02/28/2022 1558  ? MCV 89 02/28/2022 1558  ? MCH 30.3 02/28/2022 1558  ? MCHC 34.0 02/28/2022 1558  ? RDW 12.5 02/28/2022 1558  ? LYMPHSABS 2.3 02/28/2022 1558  ? EOSABS 0.1 02/28/2022 1558  ? BASOSABS 0.0 02/28/2022 1558  ? ? ?CMP  ?   ?Component Value Date/Time  ? NA 141 02/28/2022 1558  ? K 4.3 02/28/2022 1558  ? CL 103 02/28/2022 1558  ? CO2 25 02/28/2022 1558  ? GLUCOSE 85 02/28/2022 1558  ? BUN 15 02/28/2022 1558  ? CREATININE 0.90 02/28/2022 1558  ? CALCIUM 9.6 02/28/2022 1558  ? PROT 7.4  02/28/2022 1558  ? ALBUMIN 4.6 02/28/2022 1558  ? AST 31 02/28/2022 1558  ? ALT 34 02/28/2022 1558  ? ALKPHOS 84 02/28/2022 1558  ? BILITOT 0.5 02/28/2022 1558  ? GFRNONAA 94 02/01/2020 0908  ? GFRAA 108 02/01/2020 0908  ? ?Narrative & Impression ?CLINICAL DATA:  Pain left lower quadrant of abdomen ?  ?EXAM: ?CT ABDOMEN AND PELVIS WITHOUT CONTRAST ?  ?TECHNIQUE: ?Multidetector CT imaging of the abdomen and pelvis was performed ?following the standard protocol without IV contrast. ?  ?RADIATION DOSE REDUCTION: This exam was performed according to the ?departmental dose-optimization program which includes automated ?exposure control, adjustment of the mA and/or kV according to ?patient size and/or use of iterative reconstruction technique. ?  ?COMPARISON:  None. ?  ?FINDINGS: ?Lower chest: Breathing motion limits evaluation of lower lung ?fields. ?  ?Hepatobiliary: Liver measures 20.3 cm in length. No focal ?abnormality is seen. Gallbladder is unremarkable. ?  ?Pancreas: No focal abnormality is seen. ?  ?Spleen: Unremarkable. ?  ?Adrenals/Urinary Tract: Adrenals are unremarkable. There is no ?hydronephrosis. There are no renal or ureteral stones. Urinary ?bladder is unremarkable. ?  ?Stomach/Bowel: Stomach is unremarkable. Small bowel loops are not ?dilated. Appendix is difficult to visualize. There is no evidence of ?any focal pericecal inflammation. Diverticula are seen in the colon. ?There is wall thickening and pericolic stranding at the junction of ?descending and sigmoid colon in the left iliac fossa. In image 62 of ?series 2, there is small pocket of air in the sigmoid mesentery. ?There is no loculated pericolic fluid collection. ?  ?Vascular/Lymphatic: There are scattered arterial calcifications. ?  ?Reproductive: There are few coarse calcifications in the prostate. ?  ?Other: There is no ascites. There is no demonstrable ?pneumoperitoneum in the upper abdomen. ?  ?Musculoskeletal: Degenerative changes are  noted in the lower lumbar ?spine. Last lumbar vertebra appears to be transitional. ?  ?IMPRESSION: ?Diverticulosis of colon with acute diverticulitis at the junction of ?descending and sigmoid colon in the left iliac fossa. There is ?pericolic stranding and small pocket of air in the mesentery in the ?left lower quadrant related to acute sigmoid diverticulitis. There ?is no demonstrable focal pericolic abscess. ?  ?There is no evidence of intestinal obstruction or pneumoperitoneum. ?There is no hydronephrosis. ?  ?Enlarged liver. ?  ?Other findings as described in the body of the report. ?  ?  ?Electronically Signed ?  By: Elmer Picker M.D. ?  On: 01/24/2022 19:46 ? ? ?ASSESSMENT AND PLAN: ?51 year old male with recent episode of left-sided uncomplicated diverticulitis, with questionable second episode within the months of the first.  Currently, asymptomatic without abdominal pain and normal bowel habits.  We discussed the pathophysiology  of diverticulitis and the recommendations for a diagnostic colonoscopy following first episode.  We discussed recommendations for fiber supplementation to reduce future episodes of diverticulitis, as well as the lack of evidence supporting the long-held recommendation that he should not eat foods with seeds or nuts. ?I recommended that he continue to take fiber supplement indefinitely, and that he stop smoking as well, which has been associated with diverticulitis. ?We will schedule patient for routine colonoscopy. ? ?Acute diverticulitis, left-sided, uncomplicated ?- Colonoscopy to exclude malignancy, confirm diverticulosis ?- Fiber supplementation indefinitely ?- Smoking cessation ? ?The details, risks (including bleeding, perforation, infection, missed lesions, medication reactions and possible hospitalization or surgery if complications occur), benefits, and alternatives to colonoscopy with possible biopsy and possible polypectomy were discussed with the patient and  he/she consents to proceed.  ? ?Fayth Trefry E. Candis Schatz, MD ?[facility] ? ?CC:  Loman Brooklyn, FNP ? ?

## 2022-04-10 ENCOUNTER — Ambulatory Visit: Payer: Commercial Managed Care - PPO | Admitting: Family Medicine

## 2022-04-24 ENCOUNTER — Ambulatory Visit: Payer: Commercial Managed Care - PPO | Admitting: Family Medicine

## 2022-04-25 ENCOUNTER — Encounter: Payer: Self-pay | Admitting: Gastroenterology

## 2022-05-01 ENCOUNTER — Telehealth: Payer: Self-pay | Admitting: Gastroenterology

## 2022-05-01 NOTE — Telephone Encounter (Signed)
Patient called.  He's got a procedure tomorrow with Dr. Candis Schatz.  He was reading the prep box and one of the side effects is that it could cause seizures.  He is concerned because he has had seizures before.  Please call patient and advise.  Thank you.

## 2022-05-01 NOTE — Telephone Encounter (Signed)
Please see note below from pt. He is concerned about the sutab listed side effect of possible seizures.

## 2022-05-02 ENCOUNTER — Ambulatory Visit (AMBULATORY_SURGERY_CENTER): Payer: Commercial Managed Care - PPO | Admitting: Gastroenterology

## 2022-05-02 ENCOUNTER — Encounter: Payer: Self-pay | Admitting: Gastroenterology

## 2022-05-02 VITALS — BP 131/82 | HR 79 | Temp 97.8°F | Resp 19 | Ht 70.0 in | Wt 182.0 lb

## 2022-05-02 DIAGNOSIS — K5792 Diverticulitis of intestine, part unspecified, without perforation or abscess without bleeding: Secondary | ICD-10-CM | POA: Diagnosis present

## 2022-05-02 DIAGNOSIS — D127 Benign neoplasm of rectosigmoid junction: Secondary | ICD-10-CM

## 2022-05-02 DIAGNOSIS — K635 Polyp of colon: Secondary | ICD-10-CM

## 2022-05-02 DIAGNOSIS — D122 Benign neoplasm of ascending colon: Secondary | ICD-10-CM

## 2022-05-02 DIAGNOSIS — D123 Benign neoplasm of transverse colon: Secondary | ICD-10-CM

## 2022-05-02 MED ORDER — SODIUM CHLORIDE 0.9 % IV SOLN: 500.0000 mL | Freq: Once | INTRAVENOUS | Status: DC

## 2022-05-02 NOTE — Progress Notes (Signed)
History and Physical Interval Note:  05/02/2022 2:24 PM  Gavin Landry  has presented today for endoscopic procedure(s), with the diagnosis of  Encounter Diagnosis  Name Primary?   Diverticulitis Yes  .  The various methods of evaluation and treatment have been discussed with the patient and/or family. After consideration of risks, benefits and other options for treatment, the patient has consented to  the endoscopic procedure(s).   The patient's history has been reviewed, patient examined, no change in status, stable for endoscopic procedure(s).  I have reviewed the patient's chart and labs.  Questions were answered to the patient's satisfaction.     Luara Faye E. Candis Schatz, MD [facility]

## 2022-05-02 NOTE — Progress Notes (Signed)
Once pt was awake, he was assisted with ambulating laps around the recovery room.  Then pt was taken to the restroom to sit on the toilet and continue to rock back and forth.  His girlfriend is by his side.  Pt continued to pass flatus.  His abdomen Had softened, but not completely soft.  Dr. Candis Schatz spoke with pt and felt of his abdomen and said he was ok to discharge to home.

## 2022-05-02 NOTE — Patient Instructions (Addendum)
Handouts were given to your care partner on polyps, diverticulosis, and a high fiber diet with liberal fluid intake You may resume your current medications today. Await biopsy results.  May take 1-3 weeks to receive pathology results. Per Dr. Candis Schatz, he recommends smoking cessation and fiber supplement to reduce risk of recurrent Diverticulitis. Please call if any questions or concerns.     YOU HAD AN ENDOSCOPIC PROCEDURE TODAY AT [facility]:   Refer to the procedure report that was given to you for any specific questions about what was found during the examination.  If the procedure report does not answer your questions, please call your gastroenterologist to clarify.  If you requested that your care partner not be given the details of your procedure findings, then the procedure report has been included in a sealed envelope for you to review at your convenience later.  YOU SHOULD EXPECT: Some feelings of bloating in the abdomen. Passage of more gas than usual.  Walking can help get rid of the air that was put into your GI tract during the procedure and reduce the bloating. If you had a lower endoscopy (such as a colonoscopy or flexible sigmoidoscopy) you may notice spotting of blood in your stool or on the toilet paper. If you underwent a bowel prep for your procedure, you may not have a normal bowel movement for a few days.  Please Note:  You might notice some irritation and congestion in your nose or some drainage.  This is from the oxygen used during your procedure.  There is no need for concern and it should clear up in a day or so.  SYMPTOMS TO REPORT IMMEDIATELY:  Following lower endoscopy (colonoscopy or flexible sigmoidoscopy):  Excessive amounts of blood in the stool  Significant tenderness or worsening of abdominal pains  Swelling of the abdomen that is new, acute  Fever of 100F or higher   For urgent or emergent issues, a gastroenterologist can be reached at  any hour by calling [phone]. Do not use MyChart messaging for urgent concerns.    DIET:  We do recommend a small meal at first, but then you may proceed to your regular diet.  Drink plenty of fluids but you should avoid alcoholic beverages for 24 hours.  ACTIVITY:  You should plan to take it easy for the rest of today and you should NOT DRIVE or use heavy machinery until tomorrow (because of the sedation medicines used during the test).    FOLLOW UP: Our staff will call the number listed on your records 48-72 hours following your procedure to check on you and address any questions or concerns that you may have regarding the information given to you following your procedure. If we do not reach you, we will leave a message.  We will attempt to reach you two times.  During this call, we will ask if you have developed any symptoms of COVID 19. If you develop any symptoms (ie: fever, flu-like symptoms, shortness of breath, cough etc.) before then, please call [phone].  If you test positive for Covid 19 in the 2 weeks post procedure, please call and report this information to [place].    If any biopsies were taken you will be contacted by phone or by letter within the next 1-3 weeks.  Please call us at [phone] if you have not heard about the biopsies in 3 weeks.    SIGNATURES/CONFIDENTIALITY: You and/or your care partner have signed paperwork which will  be entered into your electronic medical record.  These signatures attest to the fact that that the information above on your After Visit Summary has been reviewed and is understood.  Full responsibility of the confidentiality of this discharge information lies with you and/or your care-partner.

## 2022-05-02 NOTE — Op Note (Signed)
[facility] Patient Name: Gavin Landry Procedure Date: 05/02/2022 2:18 PM MRN: [id number] Endoscopist: Nicki Reaper E. Candis Schatz , MD Age: 51 Referring MD:  Date of Birth: 1971/12/11 Gender: Male Account #: [id number] Procedure:                Colonoscopy Indications:              Follow-up of diverticulitis Medicines:                Monitored Anesthesia Care Procedure:                Pre-Anesthesia Assessment:                           - Prior to the procedure, a History and Physical                            was performed, and patient medications and                            allergies were reviewed. The patient's tolerance of                            previous anesthesia was also reviewed. The risks                            and benefits of the procedure and the sedation                            options and risks were discussed with the patient.                            All questions were answered, and informed consent                            was obtained. Prior Anticoagulants: The patient has                            taken no previous anticoagulant or antiplatelet                            agents. ASA Grade Assessment: II - A patient with                            mild systemic disease. After reviewing the risks                            and benefits, the patient was deemed in                            satisfactory condition to undergo the procedure.                           After obtaining informed consent, the colonoscope  was passed under direct vision. Throughout the                            procedure, the patient's blood pressure, pulse, and                            oxygen saturations were monitored continuously. The                            Olympus CF-HQ190L ([id number]) Colonoscope was                            introduced through the anus and advanced to the the                            cecum, identified by appendiceal  orifice and                            ileocecal valve. The colonoscopy was somewhat                            difficult due to restricted mobility of the colon.                            Successful completion of the procedure was aided by                            using manual pressure. The patient tolerated the                            procedure well. The quality of the bowel                            preparation was adequate. The ileocecal valve,                            appendiceal orifice, and rectum were photographed.                            The bowel preparation used was Sutab via split dose                            instruction. Scope In: 2:39:00 PM Scope Out: 3:05:58 PM Scope Withdrawal Time: 0 hours 19 minutes 19 seconds  Total Procedure Duration: 0 hours 26 minutes 58 seconds  Findings:                 The perianal and digital rectal examinations were                            normal. Pertinent negatives include normal                            sphincter tone and no palpable rectal lesions.  A 3 mm polyp was found in the ascending colon. The                            polyp was sessile. The polyp was removed with a                            cold snare. Resection and retrieval were complete.                            Estimated blood loss was minimal.                           A 4 mm polyp was found in the proximal transverse                            colon. The polyp was flat. The polyp was removed                            with a cold snare. Resection and retrieval were                            complete. Estimated blood loss was minimal.                           A 3 mm polyp was found in the recto-sigmoid colon.                            The polyp was sessile. The polyp was removed with a                            cold snare. Resection and retrieval were complete.                            Estimated blood loss was minimal.                            A few small-mouthed diverticula were found in the                            sigmoid colon and descending colon.                           The exam was otherwise normal throughout the                            examined colon.                           The retroflexed view of the distal rectum and anal                            verge was normal and showed no anal or rectal  abnormalities. Complications:            No immediate complications. Estimated Blood Loss:     Estimated blood loss was minimal. Impression:               - One 3 mm polyp in the ascending colon, removed                            with a cold snare. Resected and retrieved.                           - One 4 mm polyp in the proximal transverse colon,                            removed with a cold snare. Resected and retrieved.                           - One 3 mm polyp at the recto-sigmoid colon,                            removed with a cold snare. Resected and retrieved.                           - Diverticulosis in the sigmoid colon and in the                            descending colon.                           - The distal rectum and anal verge are normal on                            retroflexion view. Recommendation:           - Patient has a contact number available for                            emergencies. The signs and symptoms of potential                            delayed complications were discussed with the                            patient. Return to normal activities tomorrow.                            Written discharge instructions were provided to the                            patient.                           - Resume previous diet.                           - Continue present medications.                           -  Await pathology results.                           - Repeat colonoscopy (date not yet determined) for                             surveillance based on pathology results.                           - Recommend smoking cessation and fiber                            supplementation to reduce risk of recurrent                            diverticulitis Makaleigh Reinard E. Candis Schatz, MD 05/02/2022 3:12:04 PM This report has been signed electronically.

## 2022-05-02 NOTE — Progress Notes (Signed)
VS completed by DT.    Medical history reviewed and updated.  

## 2022-05-02 NOTE — Progress Notes (Signed)
A and O x3. Report to RN. Tolerated MAC anesthesia well. 

## 2022-05-05 ENCOUNTER — Telehealth: Payer: Self-pay | Admitting: *Deleted

## 2022-05-05 NOTE — Telephone Encounter (Signed)
  Follow up Call-     05/02/2022    2:09 PM  Call back number  Post procedure Call Back phone  # [phone]  Permission to leave phone message Yes    First attempt for follow up phone call. No answer at number given.  Left message on voicemail.

## 2022-05-05 NOTE — Telephone Encounter (Signed)
  Follow up Call-     05/02/2022    2:09 PM  Call back number  Post procedure Call Back phone  # [phone]  Permission to leave phone message Yes     Patient questions:  Do you have a fever, pain , or abdominal swelling? No. Pain Score  0 *  Have you tolerated food without any problems? Yes.    Have you been able to return to your normal activities? Yes.    Do you have any questions about your discharge instructions: Diet   No. Medications  No. Follow up visit  No.  Do you have questions or concerns about your Care? No.  Actions: * If pain score is 4 or above: No action needed, pain <4.

## 2022-05-09 NOTE — Progress Notes (Signed)
Gavin Landry,  One polyp which I removed during your recent procedure was proven to be completely benign but is considered a "pre-cancerous" polyp that MAY have grown into cancer if it had not been removed.  Studies shows that at least 20% of women over age 50 and 30% of men over age 80 have pre-cancerous polyps.  Based on current nationally recognized surveillance guidelines, I recommend that you have a repeat colonoscopy in 7 years.   If you develop any new rectal bleeding, abdominal pain or significant bowel habit changes, please contact me before then.

## 2022-05-20 ENCOUNTER — Encounter: Payer: Self-pay | Admitting: Family Medicine

## 2022-05-20 ENCOUNTER — Ambulatory Visit (INDEPENDENT_AMBULATORY_CARE_PROVIDER_SITE_OTHER): Payer: Commercial Managed Care - PPO | Admitting: Family Medicine

## 2022-05-20 VITALS — BP 137/91 | HR 123 | Temp 97.8°F | Ht 70.0 in | Wt 190.2 lb

## 2022-05-20 DIAGNOSIS — I10 Essential (primary) hypertension: Secondary | ICD-10-CM

## 2022-05-20 DIAGNOSIS — I7300 Raynaud's syndrome without gangrene: Secondary | ICD-10-CM | POA: Diagnosis not present

## 2022-05-20 DIAGNOSIS — F4323 Adjustment disorder with mixed anxiety and depressed mood: Secondary | ICD-10-CM | POA: Diagnosis not present

## 2022-05-20 DIAGNOSIS — E782 Mixed hyperlipidemia: Secondary | ICD-10-CM

## 2022-05-20 MED ORDER — FLUOXETINE HCL 10 MG PO CAPS: 10.0000 mg | ORAL_CAPSULE | Freq: Every day | ORAL | 2 refills | Status: DC

## 2022-05-20 MED ORDER — AMLODIPINE BESYLATE 5 MG PO TABS: 5.0000 mg | ORAL_TABLET | Freq: Every day | ORAL | 2 refills | Status: DC

## 2022-05-21 ENCOUNTER — Encounter: Payer: Self-pay | Admitting: Family Medicine

## 2022-05-21 LAB — CBC WITH DIFFERENTIAL/PLATELET
Basophils Absolute: 0 10*3/uL (ref 0.0–0.2)
Basos: 1 %
EOS (ABSOLUTE): 0.1 10*3/uL (ref 0.0–0.4)
Eos: 2 %
Hematocrit: 44.3 % (ref 37.5–51.0)
Hemoglobin: 14.9 g/dL (ref 13.0–17.7)
Immature Grans (Abs): 0 10*3/uL (ref 0.0–0.1)
Immature Granulocytes: 0 %
Lymphocytes Absolute: 2 10*3/uL (ref 0.7–3.1)
Lymphs: 36 %
MCH: 29.7 pg (ref 26.6–33.0)
MCHC: 33.6 g/dL (ref 31.5–35.7)
MCV: 88 fL (ref 79–97)
Monocytes Absolute: 0.5 10*3/uL (ref 0.1–0.9)
Monocytes: 9 %
Neutrophils Absolute: 2.9 10*3/uL (ref 1.4–7.0)
Neutrophils: 52 %
Platelets: 284 10*3/uL (ref 150–450)
RBC: 5.01 x10E6/uL (ref 4.14–5.80)
RDW: 13.1 % (ref 11.6–15.4)
WBC: 5.5 10*3/uL (ref 3.4–10.8)

## 2022-05-21 LAB — CMP14+EGFR
ALT: 29 IU/L (ref 0–44)
AST: 25 IU/L (ref 0–40)
Albumin/Globulin Ratio: 1.8 (ref 1.2–2.2)
Albumin: 4.7 g/dL (ref 4.0–5.0)
Alkaline Phosphatase: 77 IU/L (ref 44–121)
BUN/Creatinine Ratio: 15 (ref 9–20)
BUN: 17 mg/dL (ref 6–24)
Bilirubin Total: 0.5 mg/dL (ref 0.0–1.2)
CO2: 21 mmol/L (ref 20–29)
Calcium: 9.6 mg/dL (ref 8.7–10.2)
Chloride: 104 mmol/L (ref 96–106)
Creatinine, Ser: 1.1 mg/dL (ref 0.76–1.27)
Globulin, Total: 2.6 g/dL (ref 1.5–4.5)
Glucose: 97 mg/dL (ref 70–99)
Potassium: 4.7 mmol/L (ref 3.5–5.2)
Sodium: 141 mmol/L (ref 134–144)
Total Protein: 7.3 g/dL (ref 6.0–8.5)
eGFR: 82 mL/min/{1.73_m2} (ref >59)

## 2022-05-21 LAB — LIPID PANEL
Chol/HDL Ratio: 3.5 ratio (ref 0.0–5.0)
Cholesterol, Total: 192 mg/dL (ref 100–199)
HDL: 55 mg/dL (ref >39)
LDL Chol Calc (NIH): 105 mg/dL — ABNORMAL HIGH (ref 0–99)
Triglycerides: 183 mg/dL — ABNORMAL HIGH (ref 0–149)
VLDL Cholesterol Cal: 32 mg/dL (ref 5–40)

## 2022-05-21 NOTE — Progress Notes (Signed)
Assessment & Plan:  1. Essential hypertension Uncontrolled. Continue Lisinopril. Add amlodipine.  - CBC with Differential/Platelet - Lipid panel - CMP14+EGFR - amLODipine (NORVASC) 5 MG tablet; Take 1 tablet (5 mg total) by mouth daily.  Dispense: 30 tablet; Refill: 2  2. Mixed hyperlipidemia Labs to assess for improvement. - CBC with Differential/Platelet - Lipid panel - CMP14+EGFR  3. Situational mixed anxiety and depressive disorder Uncontrolled. D/C Lexapro. Start Prozac. - CMP14+EGFR - FLUoxetine (PROZAC) 10 MG capsule; Take 1 capsule (10 mg total) by mouth daily.  Dispense: 30 capsule; Refill: 2  4. Raynaud's phenomenon without gangrene New diagnosis. Education provided on Raynaud's.  - amLODipine (NORVASC) 5 MG tablet; Take 1 tablet (5 mg total) by mouth daily.  Dispense: 30 tablet; Refill: 2   Return in about 3 months (around 08/20/2022) for HTN & Raynauds.  Hendricks Limes, MSN, APRN, FNP-C [facility]  Subjective:    Patient ID: Gavin Landry, male    DOB: 08/15/1971, 51 y.o.   MRN: [id number]  Patient Care Team: Loman Brooklyn, FNP as PCP - General (Family Medicine)   Chief Complaint:  Chief Complaint  Patient presents with   Medical Management of Chronic Issues    HPI: Gavin Landry is a 51 y.o. male presenting on 05/20/2022 for Medical Management of Chronic Issues  Hypertension: patient has been taking Lisinopril consistently and has had it this morning. He does not monitor his blood pressure at home.  Hyperlipidemia: taking atorvastatin daily.  Anxiety/Depression: has been taking Lexapro as needed.  He reports it knocks him out cold when he takes it. He has previously failed treatment with Cymbalta and Zoloft.     05/20/2022    3:41 PM 02/28/2022    3:54 PM 07/24/2021   11:23 AM  Depression screen PHQ 2/9  Decreased Interest _0 Down, Depressed, Hopeless _1 PHQ - 2 Score _2 Altered sleeping 3 0 0  Tired,  decreased energy 0 3 0  Change in appetite 0 0 1  Feeling bad or failure about yourself  _3 Trouble concentrating 1 0 2  Moving slowly or fidgety/restless 1 2 0  Suicidal thoughts 0 0 0  PHQ-9 Score _4 Difficult doing work/chores Somewhat difficult Very difficult Somewhat difficult      05/20/2022    3:41 PM 02/28/2022    3:55 PM 07/24/2021   11:26 AM 05/01/2021    8:06 AM  GAD 7 : Generalized Anxiety Score  Nervous, Anxious, on Edge 0 _5 Control/stop worrying 0 _6 Worry too much - different things _7 Trouble relaxing 0 _8 Restless 0 _9 Easily annoyed or irritable 0 _10 Afraid - awful might happen _11 Total GAD 7 Score _12 Anxiety Difficulty Very difficult Somewhat difficult Very difficult Somewhat difficult    New complaints: Patient reports the first three fingers on both hands are always stinging with pain. They turn colors from red to white. States he cannot touch things bare handed or it feels like his hands are on fire. This has been ongoing for years.   Social history:  Relevant past medical, surgical, family and social history reviewed and updated as indicated. Interim medical history since our last visit reviewed.  Allergies and medications reviewed and updated.  DATA REVIEWED:  CHART IN EPIC  ROS: Negative unless specifically indicated above in HPI.    Current Outpatient Medications:    amLODipine (NORVASC) 5 MG tablet, Take 1 tablet (5 mg total) by mouth daily., Disp: 30 tablet, Rfl: 2   atorvastatin (LIPITOR) 20 MG tablet, Take 1 tablet (20 mg total) by mouth daily., Disp: 90 tablet, Rfl: 1   FLUoxetine (PROZAC) 10 MG capsule, Take 1 capsule (10 mg total) by mouth daily., Disp: 30 capsule, Rfl: 2   lisinopril (ZESTRIL) 40 MG tablet, Take 1 tablet (40 mg total) by mouth daily., Disp: 90 tablet, Rfl: 1   Allergies  Allergen Reactions   Iodine    Other     IV contrast   Past Medical History:  Diagnosis Date    Allergy    Anxiety    Arthritis    Depression    Hyperlipidemia    Hypertension    Seizures ([place])     Past Surgical History:  Procedure Laterality Date   APPENDECTOMY     COLONOSCOPY     KNEE SURGERY Left    VASECTOMY      Social History   Socioeconomic History   Marital status: Single    Spouse name: Not on file   Number of children: 4   Years of education: Not on file   Highest education level: Not on file  Occupational History   Occupation: painter  Tobacco Use   Smoking status: Every Day    Packs/day: 0.50    Types: Cigarettes    Start date: 1983   Smokeless tobacco: Never  Vaping Use   Vaping Use: Never used  Substance and Sexual Activity   Alcohol use: Yes    Alcohol/week: 7.0 standard drinks    Types: 7 Cans of beer per week    Comment: 1-2 beers a day   Drug use: Yes    Types: Marijuana   Sexual activity: Not on file  Other Topics Concern   Not on file  Social History Narrative   Not on file   Social Determinants of Health   Financial Resource Strain: Not on file  Food Insecurity: Not on file  Transportation Needs: Not on file  Physical Activity: Not on file  Stress: Not on file  Social Connections: Not on file  Intimate Partner Violence: Not on file        Objective:    BP (!) 137/91   Pulse (!) 123   Temp 97.8 F (36.6 C) (Temporal)   Ht 5' 10" (1.778 m)   Wt 190 lb 3.2 oz (86.3 kg)   SpO2 94%   BMI 27.29 kg/m   Wt Readings from Last 3 Encounters:  05/20/22 190 lb 3.2 oz (86.3 kg)  05/02/22 182 lb (82.6 kg)  04/02/22 182 lb 1 oz (82.6 kg)    Physical Exam Vitals reviewed.  Constitutional:      General: He is not in acute distress.    Appearance: Normal appearance. He is not ill-appearing, toxic-appearing or diaphoretic.  HENT:     Head: Normocephalic and atraumatic.  Eyes:     General: No scleral icterus.       Right eye: No discharge.        Left eye: No discharge.     Conjunctiva/sclera: Conjunctivae normal.   Cardiovascular:     Rate and Rhythm: Normal rate and regular rhythm.     Heart sounds: Normal heart sounds. No murmur heard.   No friction rub. No gallop.  Pulmonary:     Effort: Pulmonary effort is normal. No respiratory distress.     Breath sounds: Normal breath sounds. No stridor. No wheezing, rhonchi or rales.  Musculoskeletal:        General: Normal range of motion.     Cervical back: Normal range of motion.  Skin:    General: Skin is warm and dry.  Neurological:     Mental Status: He is alert and oriented to person, place, and time. Mental status is at baseline.  Psychiatric:        Mood and Affect: Mood normal.        Behavior: Behavior normal.        Thought Content: Thought content normal.        Judgment: Judgment normal.    Lab Results  Component Value Date   TSH 0.562 02/01/2020   Lab Results  Component Value Date   WBC 5.5 05/20/2022   HGB 14.9 05/20/2022   HCT 44.3 05/20/2022   MCV 88 05/20/2022   PLT 284 05/20/2022   Lab Results  Component Value Date   NA 141 05/20/2022   K 4.7 05/20/2022   CO2 21 05/20/2022   GLUCOSE 97 05/20/2022   BUN 17 05/20/2022   CREATININE 1.10 05/20/2022   BILITOT 0.5 05/20/2022   ALKPHOS 77 05/20/2022   AST 25 05/20/2022   ALT 29 05/20/2022   PROT 7.3 05/20/2022   ALBUMIN 4.7 05/20/2022   CALCIUM 9.6 05/20/2022   EGFR 82 05/20/2022   Lab Results  Component Value Date   CHOL 192 05/20/2022   Lab Results  Component Value Date   HDL 55 05/20/2022   Lab Results  Component Value Date   LDLCALC 105 (H) 05/20/2022   Lab Results  Component Value Date   TRIG 183 (H) 05/20/2022   Lab Results  Component Value Date   CHOLHDL 3.5 05/20/2022   No results found for: HGBA1C         

## 2022-08-20 ENCOUNTER — Ambulatory Visit (INDEPENDENT_AMBULATORY_CARE_PROVIDER_SITE_OTHER): Payer: Commercial Managed Care - PPO

## 2022-08-20 ENCOUNTER — Encounter: Payer: Self-pay | Admitting: Family Medicine

## 2022-08-20 ENCOUNTER — Ambulatory Visit (INDEPENDENT_AMBULATORY_CARE_PROVIDER_SITE_OTHER): Payer: Commercial Managed Care - PPO | Admitting: Family Medicine

## 2022-08-20 VITALS — BP 161/102 | HR 95 | Temp 97.9°F | Resp 20 | Ht 70.0 in | Wt 188.0 lb

## 2022-08-20 DIAGNOSIS — M79642 Pain in left hand: Secondary | ICD-10-CM | POA: Diagnosis not present

## 2022-08-20 DIAGNOSIS — E782 Mixed hyperlipidemia: Secondary | ICD-10-CM | POA: Diagnosis not present

## 2022-08-20 DIAGNOSIS — I7300 Raynaud's syndrome without gangrene: Secondary | ICD-10-CM | POA: Diagnosis not present

## 2022-08-20 DIAGNOSIS — F4323 Adjustment disorder with mixed anxiety and depressed mood: Secondary | ICD-10-CM

## 2022-08-20 DIAGNOSIS — Z91148 Patient's other noncompliance with medication regimen for other reason: Secondary | ICD-10-CM

## 2022-08-20 DIAGNOSIS — I10 Essential (primary) hypertension: Secondary | ICD-10-CM | POA: Diagnosis not present

## 2022-08-20 MED ORDER — ATORVASTATIN CALCIUM 20 MG PO TABS: 20.0000 mg | ORAL_TABLET | Freq: Every day | ORAL | 1 refills | Status: DC

## 2022-08-20 MED ORDER — FLUOXETINE HCL 20 MG PO CAPS: 20.0000 mg | ORAL_CAPSULE | Freq: Every day | ORAL | 2 refills | Status: DC

## 2022-08-20 MED ORDER — LISINOPRIL 40 MG PO TABS: 40.0000 mg | ORAL_TABLET | Freq: Every day | ORAL | 1 refills | Status: DC

## 2022-08-20 MED ORDER — AMLODIPINE BESYLATE 10 MG PO TABS: 10.0000 mg | ORAL_TABLET | Freq: Every day | ORAL | 2 refills | Status: DC

## 2022-08-20 MED ORDER — PREDNISONE 10 MG (21) PO TBPK: ORAL_TABLET | ORAL | 0 refills | Status: DC

## 2022-08-20 NOTE — Progress Notes (Signed)
Assessment & Plan:  1. Essential hypertension Uncontrolled without medication.  Encouraged to resume daily medications. - amLODipine (NORVASC) 10 MG tablet; Take 1 tablet (10 mg total) by mouth daily.  Dispense: 30 tablet; Refill: 2 - lisinopril (ZESTRIL) 40 MG tablet; Take 1 tablet (40 mg total) by mouth daily.  Dispense: 90 tablet; Refill: 1  2. Mixed hyperlipidemia - atorvastatin (LIPITOR) 20 MG tablet; Take 1 tablet (20 mg total) by mouth daily.  Dispense: 90 tablet; Refill: 1  3. Raynaud's phenomenon without gangrene Somewhat controlled with medication. Dosage of amlodipine increased from 5 mg to 10 mg daily. - amLODipine (NORVASC) 10 MG tablet; Take 1 tablet (10 mg total) by mouth daily.  Dispense: 30 tablet; Refill: 2  4. Situational mixed anxiety and depressive disorder Somewhat controlled when he was taking Prozac.  Dosage increased from 10 mg to 20 mg daily. - FLUoxetine (PROZAC) 20 MG capsule; Take 1 capsule (20 mg total) by mouth daily.  Dispense: 30 capsule; Refill: 2  5. Left hand pain Exercises provided for him to complete at home.  He declines a referral to orthopedic at this time. - DG Hand Complete Left; Future - predniSONE (STERAPRED UNI-PAK 21 TAB) 10 MG (21) TBPK tablet; As directed x 6 days  Dispense: 21 tablet; Refill: 0  6. Noncompliance with medications Encouraged to resume medications daily to control medical conditions.   Return 4-6 weeks after resuming medications, for follow-up of chronic medication conditions.  Hendricks Limes, MSN, APRN, FNP-C [facility]  Subjective:    Patient ID: Gavin Landry, male    DOB: [birth year], 51 y.o.   MRN: [id number]  Patient Care Team: Loman Brooklyn, FNP as PCP - General (Family Medicine)   Chief Complaint:  Chief Complaint  Patient presents with   Medical Management of Chronic Issues    HTN - 3 mo    Hand Pain    Left hand pain - fall in shower 1 mo ago     HPI: ERVAN Landry  is a 51 y.o. male presenting on 08/20/2022 for Medical Management of Chronic Issues (HTN - 3 mo ) and Hand Pain (Left hand pain - fall in shower 1 mo ago )  Patient reports he has been out of all his medications for 1-2 months.   Hypertension: previously prescribed amlodipine to take in addition to lisinopril. He does not monitor his blood pressure at home.  Hyperlipidemia: taking atorvastatin daily.  Raynaud's: symptoms improved with amlodipine, but did not resolve.  Anxiety/Depression: most recently prescribed Prozac, which he reports was helpful, but is requesting a higher dosage. He has previously failed treatment with Lexapro, Cymbalta and Zoloft.     08/20/2022    3:56 PM 05/20/2022    3:41 PM 02/28/2022    3:54 PM  Depression screen PHQ 2/9  Decreased Interest 2 2 2   Down, Depressed, Hopeless 2 1 3   PHQ - 2 Score 4 3 5   Altered sleeping 3 3 0  Tired, decreased energy 3 0 3  Change in appetite 0 0 0  Feeling bad or failure about yourself  1 1 2   Trouble concentrating 0 1 0  Moving slowly or fidgety/restless 1 1 2   Suicidal thoughts 0 0 0  PHQ-9 Score 12 9 12   Difficult doing work/chores Somewhat difficult Somewhat difficult Very difficult      08/20/2022    3:57 PM 05/20/2022    3:41 PM 02/28/2022    3:55 PM 07/24/2021  11:26 AM  GAD 7 : Generalized Anxiety Score  Nervous, Anxious, on Edge 2 0 3 3  Control/stop worrying 1 0 1 3  Worry too much - different things 0 2 3 3   Trouble relaxing 0 0 2 2  Restless 0 0 1 3  Easily annoyed or irritable 0 0 3 3  Afraid - awful might happen 1 2 3 3   Total GAD 7 Score 4 4 16 20   Anxiety Difficulty Somewhat difficult Very difficult Somewhat difficult Very difficult    New complaints: Patient reports left hand pain after falling in the shower a month ago. He felt like the thumb bent backward to his arm.    Social history:  Relevant past medical, surgical, family and social history reviewed and updated as indicated. Interim medical  history since our last visit reviewed.  Allergies and medications reviewed and updated.  DATA REVIEWED: CHART IN EPIC  ROS: Negative unless specifically indicated above in HPI.    Current Outpatient Medications:    amLODipine (NORVASC) 5 MG tablet, Take 1 tablet (5 mg total) by mouth daily. (Patient not taking: Reported on 08/20/2022), Disp: 30 tablet, Rfl: 2   atorvastatin (LIPITOR) 20 MG tablet, Take 1 tablet (20 mg total) by mouth daily. (Patient not taking: Reported on 08/20/2022), Disp: 90 tablet, Rfl: 1   FLUoxetine (PROZAC) 10 MG capsule, Take 1 capsule (10 mg total) by mouth daily. (Patient not taking: Reported on 08/20/2022), Disp: 30 capsule, Rfl: 2   lisinopril (ZESTRIL) 40 MG tablet, Take 1 tablet (40 mg total) by mouth daily. (Patient not taking: Reported on 08/20/2022), Disp: 90 tablet, Rfl: 1   Allergies  Allergen Reactions   Iodine    Other     IV contrast   Past Medical History:  Diagnosis Date   Allergy    Anxiety    Arthritis    Depression    Hyperlipidemia    Hypertension    Seizures ([place])     Past Surgical History:  Procedure Laterality Date   APPENDECTOMY     COLONOSCOPY     KNEE SURGERY Left    VASECTOMY      Social History   Socioeconomic History   Marital status: Single    Spouse name: Not on file   Number of children: 4   Years of education: Not on file   Highest education level: Not on file  Occupational History   Occupation: painter  Tobacco Use   Smoking status: Every Day    Packs/day: 0.50    Types: Cigarettes    Start date: 1983   Smokeless tobacco: Never  Vaping Use   Vaping Use: Never used  Substance and Sexual Activity   Alcohol use: Yes    Alcohol/week: 7.0 standard drinks of alcohol    Types: 7 Cans of beer per week    Comment: 1-2 beers a day   Drug use: Yes    Types: Marijuana   Sexual activity: Not on file  Other Topics Concern   Not on file  Social History Narrative   Not on file   Social Determinants of Health    Financial Resource Strain: Not on file  Food Insecurity: Not on file  Transportation Needs: Not on file  Physical Activity: Not on file  Stress: Not on file  Social Connections: Not on file  Intimate Partner Violence: Not on file        Objective:    BP (!) 161/102   Pulse 95  Temp 97.9 F (36.6 C)   Resp 20   Ht 5' 10"  (1.778 m)   Wt 188 lb (85.3 kg)   SpO2 95%   BMI 26.98 kg/m   Wt Readings from Last 3 Encounters:  08/20/22 188 lb (85.3 kg)  05/20/22 190 lb 3.2 oz (86.3 kg)  05/02/22 182 lb (82.6 kg)    Physical Exam Vitals reviewed.  Constitutional:      General: He is not in acute distress.    Appearance: Normal appearance. He is not ill-appearing, toxic-appearing or diaphoretic.  HENT:     Head: Normocephalic and atraumatic.  Eyes:     General: No scleral icterus.       Right eye: No discharge.        Left eye: No discharge.     Conjunctiva/sclera: Conjunctivae normal.  Cardiovascular:     Rate and Rhythm: Normal rate and regular rhythm.     Heart sounds: Normal heart sounds. No murmur heard.    No friction rub. No gallop.  Pulmonary:     Effort: Pulmonary effort is normal. No respiratory distress.     Breath sounds: Normal breath sounds. No stridor. No wheezing, rhonchi or rales.  Musculoskeletal:     Left wrist: Tenderness present.     Left hand: Tenderness (along thumb) present. No swelling or lacerations. Decreased range of motion. Decreased strength. Normal capillary refill. Normal pulse.     Cervical back: Normal range of motion.  Skin:    General: Skin is warm and dry.  Neurological:     Mental Status: He is alert and oriented to person, place, and time. Mental status is at baseline.  Psychiatric:        Mood and Affect: Mood normal.        Behavior: Behavior normal.        Thought Content: Thought content normal.        Judgment: Judgment normal.     Lab Results  Component Value Date   TSH 0.562 02/01/2020   Lab Results  Component  Value Date   WBC 5.5 05/20/2022   HGB 14.9 05/20/2022   HCT 44.3 05/20/2022   MCV 88 05/20/2022   PLT 284 05/20/2022   Lab Results  Component Value Date   NA 141 05/20/2022   K 4.7 05/20/2022   CO2 21 05/20/2022   GLUCOSE 97 05/20/2022   BUN 17 05/20/2022   CREATININE 1.10 05/20/2022   BILITOT 0.5 05/20/2022   ALKPHOS 77 05/20/2022   AST 25 05/20/2022   ALT 29 05/20/2022   PROT 7.3 05/20/2022   ALBUMIN 4.7 05/20/2022   CALCIUM 9.6 05/20/2022   EGFR 82 05/20/2022   Lab Results  Component Value Date   CHOL 192 05/20/2022   Lab Results  Component Value Date   HDL 55 05/20/2022   Lab Results  Component Value Date   LDLCALC 105 (H) 05/20/2022   Lab Results  Component Value Date   TRIG 183 (H) 05/20/2022   Lab Results  Component Value Date   CHOLHDL 3.5 05/20/2022   No results found for: "HGBA1C"

## 2022-08-20 NOTE — Patient Instructions (Addendum)
CAP/DA Program  [facility] in [place]

## 2022-09-25 ENCOUNTER — Ambulatory Visit (INDEPENDENT_AMBULATORY_CARE_PROVIDER_SITE_OTHER): Payer: Commercial Managed Care - PPO | Admitting: Family Medicine

## 2022-09-25 ENCOUNTER — Encounter: Payer: Self-pay | Admitting: Family Medicine

## 2022-09-25 VITALS — BP 135/83 | HR 105 | Temp 98.2°F | Ht 70.0 in | Wt 196.8 lb

## 2022-09-25 DIAGNOSIS — I10 Essential (primary) hypertension: Secondary | ICD-10-CM

## 2022-09-25 DIAGNOSIS — I7300 Raynaud's syndrome without gangrene: Secondary | ICD-10-CM | POA: Diagnosis not present

## 2022-09-25 DIAGNOSIS — R202 Paresthesia of skin: Secondary | ICD-10-CM

## 2022-09-25 DIAGNOSIS — M2550 Pain in unspecified joint: Secondary | ICD-10-CM

## 2022-09-25 DIAGNOSIS — F4323 Adjustment disorder with mixed anxiety and depressed mood: Secondary | ICD-10-CM

## 2022-09-25 DIAGNOSIS — G8929 Other chronic pain: Secondary | ICD-10-CM | POA: Insufficient documentation

## 2022-09-25 MED ORDER — DULOXETINE HCL 30 MG PO CPEP: 30.0000 mg | ORAL_CAPSULE | Freq: Every day | ORAL | 3 refills | Status: DC

## 2022-09-25 MED ORDER — AMLODIPINE BESYLATE 10 MG PO TABS: 10.0000 mg | ORAL_TABLET | Freq: Every day | ORAL | 1 refills | Status: DC

## 2022-09-25 MED ORDER — FLUOXETINE HCL 20 MG PO CAPS: 20.0000 mg | ORAL_CAPSULE | Freq: Every day | ORAL | 1 refills | Status: DC

## 2022-09-25 NOTE — Progress Notes (Signed)
Subjective:  Patient ID: Gavin Landry, male    DOB: 05-02-1971, 51 y.o.   MRN: [id number]  Patient Care Team: Baruch Gouty, FNP as PCP - General (Family Medicine)   Chief Complaint:  Establish Care [facility] patient ), Medical Management of Chronic Issues, and Hand Pain (Bilateral hand pain that has been going on for years. )   HPI: Gavin Landry is a 51 y.o. male presenting on 09/25/2022 for Establish Care Blanch Media patient ), Medical Management of Chronic Issues, and Hand Pain (Bilateral hand pain that has been going on for years. )   Pt presents today to establish care with new PCP as his is no longer at the office.   1. Essential hypertension Restarted medications as prescribed and has been tolerating well. Denies lower extremity swelling with high dose amlodipine. No headaches, chest pain, palpitations, weakness, confusion, visual changes, or shortness of breath.   2. Raynaud's phenomenon without gangrene On high dose amlodipine, reports continued symptoms despite therapy. States may be worsening. No new injuries.   3. Situational mixed anxiety and depressive disorder Doing well on current regiment without associated side effects. No SI or HI. States the worsening pain has increased his symptoms.     09/25/2022    3:30 PM 08/20/2022    3:56 PM 05/20/2022    3:41 PM 02/28/2022    3:54 PM 07/24/2021   11:23 AM  Depression screen PHQ 2/9  Decreased Interest _0 Down, Depressed, Hopeless _1 PHQ - 2 Score _2 Altered sleeping _3 0 0  Tired, decreased energy 3 3 0 3 0  Change in appetite 0 0 0 0 1  Feeling bad or failure about yourself  _4 Trouble concentrating 2 0 1 0 2  Moving slowly or fidgety/restless _5 0  Suicidal thoughts 0 0 0 0 0  PHQ-9 Score _6 Difficult doing work/chores Somewhat difficult Somewhat difficult Somewhat difficult Very difficult Somewhat difficult      09/25/2022    3:30 PM 08/20/2022    3:57 PM  05/20/2022    3:41 PM 02/28/2022    3:55 PM  GAD 7 : Generalized Anxiety Score  Nervous, Anxious, on Edge 0 2 0 3  Control/stop worrying 0 1 0 1  Worry too much - different things 0 0 2 3  Trouble relaxing 2 0 0 2  Restless 0 0 0 1  Easily annoyed or irritable 0 0 0 3  Afraid - awful might happen 0 _7 Total GAD 7 Score _8 Anxiety Difficulty Somewhat difficult Somewhat difficult Very difficult Somewhat difficult    4. Chronic pain of multiple joints 5. Paresthesia of both hands Ongoing and worsening over the years. No injuries. No prior ANA noted. States tylenol and motrin are not helping.      Relevant past medical, surgical, family, and social history reviewed and updated as indicated.  Allergies and medications reviewed and updated. Data reviewed: Chart in Epic.   Past Medical History:  Diagnosis Date   Allergy    Anxiety    Arthritis    Depression    Hyperlipidemia    Hypertension    Seizures ([place])     Past Surgical History:  Procedure Laterality Date   APPENDECTOMY     COLONOSCOPY  KNEE SURGERY Left    VASECTOMY      Social History   Socioeconomic History   Marital status: Single    Spouse name: Not on file   Number of children: 4   Years of education: Not on file   Highest education level: Not on file  Occupational History   Occupation: painter  Tobacco Use   Smoking status: Every Day    Packs/day: 0.50    Types: Cigarettes    Start date: 47   Smokeless tobacco: Never  Vaping Use   Vaping Use: Never used  Substance and Sexual Activity   Alcohol use: Yes    Alcohol/week: 7.0 standard drinks of alcohol    Types: 7 Cans of beer per week    Comment: 1-2 beers a day   Drug use: Yes    Types: Marijuana   Sexual activity: Not on file  Other Topics Concern   Not on file  Social History Narrative   Not on file   Social Determinants of Health   Financial Resource Strain: Not on file  Food Insecurity: Not on file  Transportation  Needs: Not on file  Physical Activity: Not on file  Stress: Not on file  Social Connections: Not on file  Intimate Partner Violence: Not on file    Outpatient Encounter Medications as of 09/25/2022  Medication Sig   atorvastatin (LIPITOR) 20 MG tablet Take 1 tablet (20 mg total) by mouth daily.   DULoxetine (CYMBALTA) 30 MG capsule Take 1 capsule (30 mg total) by mouth daily.   lisinopril (ZESTRIL) 40 MG tablet Take 1 tablet (40 mg total) by mouth daily.   [DISCONTINUED] amLODipine (NORVASC) 10 MG tablet Take 1 tablet (10 mg total) by mouth daily.   [DISCONTINUED] FLUoxetine (PROZAC) 20 MG capsule Take 1 capsule (20 mg total) by mouth daily.   amLODipine (NORVASC) 10 MG tablet Take 1 tablet (10 mg total) by mouth daily.   FLUoxetine (PROZAC) 20 MG capsule Take 1 capsule (20 mg total) by mouth daily.   [DISCONTINUED] predniSONE (STERAPRED UNI-PAK 21 TAB) 10 MG (21) TBPK tablet As directed x 6 days   No facility-administered encounter medications on file as of 09/25/2022.    Allergies  Allergen Reactions   Iodine    Other     IV contrast    Review of Systems  Constitutional:  Negative for activity change, appetite change, chills, diaphoresis, fatigue, fever and unexpected weight change.  HENT: Negative.    Eyes: Negative.   Respiratory:  Negative for cough, chest tightness and shortness of breath.   Cardiovascular:  Negative for palpitations and leg swelling.  Gastrointestinal:  Negative for abdominal pain, blood in stool, constipation, diarrhea, nausea and vomiting.  Endocrine: Negative.   Genitourinary:  Negative for decreased urine volume, difficulty urinating, dysuria, frequency and urgency.  Musculoskeletal:  Positive for arthralgias, back pain, joint swelling and myalgias.  Skin: Negative.   Allergic/Immunologic: Negative.   Neurological:  Negative for dizziness and headaches.  Hematological: Negative.   Psychiatric/Behavioral:  Negative for confusion, hallucinations,  sleep disturbance and suicidal ideas.   All other systems reviewed and are negative.       Objective:  BP 135/83   Pulse (!) 105   Temp 98.2 F (36.8 C) (Temporal)   Ht 5' 10" (1.778 m)   Wt 196 lb 12.8 oz (89.3 kg)   SpO2 95%   BMI 28.24 kg/m    Wt Readings from Last 3 Encounters:  09/25/22 196 lb 12.8  oz (89.3 kg)  08/20/22 188 lb (85.3 kg)  05/20/22 190 lb 3.2 oz (86.3 kg)    Physical Exam Vitals and nursing note reviewed.  Constitutional:      General: He is not in acute distress.    Appearance: Normal appearance. He is well-developed and well-groomed. He is not ill-appearing, toxic-appearing or diaphoretic.  HENT:     Head: Normocephalic and atraumatic.     Jaw: There is normal jaw occlusion.     Right Ear: Hearing normal.     Left Ear: Hearing normal.     Nose: Nose normal.     Mouth/Throat:     Lips: Pink.     Mouth: Mucous membranes are moist.     Pharynx: Oropharynx is clear. Uvula midline.  Eyes:     General: Lids are normal.     Extraocular Movements: Extraocular movements intact.     Conjunctiva/sclera: Conjunctivae normal.     Pupils: Pupils are equal, round, and reactive to light.  Neck:     Thyroid: No thyroid mass, thyromegaly or thyroid tenderness.     Vascular: No carotid bruit or JVD.     Trachea: Trachea and phonation normal.  Cardiovascular:     Rate and Rhythm: Normal rate and regular rhythm.     Chest Wall: PMI is not displaced.     Pulses: Normal pulses.     Heart sounds: Normal heart sounds. No murmur heard.    No friction rub. No gallop.  Pulmonary:     Effort: Pulmonary effort is normal. No respiratory distress.     Breath sounds: Normal breath sounds. No wheezing.  Abdominal:     General: Bowel sounds are normal. There is no distension or abdominal bruit.     Palpations: Abdomen is soft. There is no hepatomegaly or splenomegaly.     Tenderness: There is no abdominal tenderness. There is no right CVA tenderness or left CVA  tenderness.     Hernia: No hernia is present.  Musculoskeletal:     Right forearm: Normal.     Left forearm: Normal.     Right wrist: Tenderness present. Normal range of motion.     Left wrist: Tenderness present. Normal range of motion.     Right hand: Tenderness present. Decreased range of motion. Decreased strength.     Left hand: Tenderness present. Decreased range of motion.     Cervical back: Normal range of motion and neck supple.     Right upper leg: Normal.     Left upper leg: Normal.     Right knee: No swelling. Normal range of motion. Tenderness present.     Left knee: No swelling. Normal range of motion. Tenderness present.     Right lower leg: Normal. No edema.     Left lower leg: Normal. No edema.  Lymphadenopathy:     Cervical: No cervical adenopathy.  Skin:    General: Skin is warm and dry.     Capillary Refill: Capillary refill takes less than 2 seconds.     Coloration: Skin is not cyanotic, jaundiced or pale.     Findings: No rash.  Neurological:     General: No focal deficit present.     Mental Status: He is alert and oriented to person, place, and time.     Sensory: Sensation is intact.     Motor: Motor function is intact.     Coordination: Coordination is intact.     Gait: Gait is intact.  Deep Tendon Reflexes: Reflexes are normal and symmetric.  Psychiatric:        Attention and Perception: Attention and perception normal.        Mood and Affect: Mood and affect normal.        Speech: Speech normal.        Behavior: Behavior normal. Behavior is cooperative.        Thought Content: Thought content normal.        Cognition and Memory: Cognition and memory normal.        Judgment: Judgment normal.     Results for orders placed or performed in visit on 05/20/22  CBC with Differential/Platelet  Result Value Ref Range   WBC 5.5 3.4 - 10.8 x10E3/uL   RBC 5.01 4.14 - 5.80 x10E6/uL   Hemoglobin 14.9 13.0 - 17.7 g/dL   Hematocrit 44.3 37.5 - 51.0 %    MCV 88 79 - 97 fL   MCH 29.7 26.6 - 33.0 pg   MCHC 33.6 31.5 - 35.7 g/dL   RDW 13.1 11.6 - 15.4 %   Platelets 284 150 - 450 x10E3/uL   Neutrophils 52 Not Estab. %   Lymphs 36 Not Estab. %   Monocytes 9 Not Estab. %   Eos 2 Not Estab. %   Basos 1 Not Estab. %   Neutrophils Absolute 2.9 1.4 - 7.0 x10E3/uL   Lymphocytes Absolute 2.0 0.7 - 3.1 x10E3/uL   Monocytes Absolute 0.5 0.1 - 0.9 x10E3/uL   EOS (ABSOLUTE) 0.1 0.0 - 0.4 x10E3/uL   Basophils Absolute 0.0 0.0 - 0.2 x10E3/uL   Immature Granulocytes 0 Not Estab. %   Immature Grans (Abs) 0.0 0.0 - 0.1 x10E3/uL  Lipid panel  Result Value Ref Range   Cholesterol, Total 192 100 - 199 mg/dL   Triglycerides 183 (H) 0 - 149 mg/dL   HDL 55 >39 mg/dL   VLDL Cholesterol Cal 32 5 - 40 mg/dL   LDL Chol Calc (NIH) 105 (H) 0 - 99 mg/dL   Chol/HDL Ratio 3.5 0.0 - 5.0 ratio  CMP14+EGFR  Result Value Ref Range   Glucose 97 70 - 99 mg/dL   BUN 17 6 - 24 mg/dL   Creatinine, Ser 1.10 0.76 - 1.27 mg/dL   eGFR 82 >59 mL/min/1.73   BUN/Creatinine Ratio 15 9 - 20   Sodium 141 134 - 144 mmol/L   Potassium 4.7 3.5 - 5.2 mmol/L   Chloride 104 96 - 106 mmol/L   CO2 21 20 - 29 mmol/L   Calcium 9.6 8.7 - 10.2 mg/dL   Total Protein 7.3 6.0 - 8.5 g/dL   Albumin 4.7 4.0 - 5.0 g/dL   Globulin, Total 2.6 1.5 - 4.5 g/dL   Albumin/Globulin Ratio 1.8 1.2 - 2.2   Bilirubin Total 0.5 0.0 - 1.2 mg/dL   Alkaline Phosphatase 77 44 - 121 IU/L   AST 25 0 - 40 IU/L   ALT 29 0 - 44 IU/L       Pertinent labs & imaging results that were available during my care of the patient were reviewed by me and considered in my medical decision making.  Assessment & Plan:  Leven was seen today for establish care, medical management of chronic issues and hand pain.  Diagnoses and all orders for this visit:  Essential hypertension HTN better controlled now that pt is compliant with medications. Will repeat labs today. DASH diet and exercise encouraged.  -     amLODipine  (NORVASC) 10 MG  tablet; Take 1 tablet (10 mg total) by mouth daily. -     CBC with Differential/Platelet -     CMP14+EGFR -     Thyroid Panel With TSH -     Lipid panel  Raynaud's phenomenon without gangrene Paresthesias of both hands  Ongoing and worsening symptoms per pt. Will check for other potential underlying causes such as autoimmune disorders, Vit D or B 12 deficiency. Continue CCB therapy.  -     amLODipine (NORVASC) 10 MG tablet; Take 1 tablet (10 mg total) by mouth daily. -     ANA Comprehensive Panel -     VITAMIN D 25 Hydroxy (Vit-D Deficiency, Fractures) -     Vitamin B12  Situational mixed anxiety and depressive disorder Doing well on below. Will continue.  -     FLUoxetine (PROZAC) 20 MG capsule; Take 1 capsule (20 mg total) by mouth daily. -     Thyroid Panel With TSH  Chronic pain of multiple joints Ongoing for years and worsening per pt. Will check below for potential underlying causes. Will add Cymbalta to current regimen to see if beneficial for the pain. Follow up in 2 months, sooner if warranted.  -     ANA Comprehensive Panel -     CBC with Differential/Platelet -     CMP14+EGFR -     Thyroid Panel With TSH -     VITAMIN D 25 Hydroxy (Vit-D Deficiency, Fractures) -     Vitamin B12 -     DULoxetine (CYMBALTA) 30 MG capsule; Take 1 capsule (30 mg total) by mouth daily.   Continue all other maintenance medications.  Follow up plan: Return in about 3 months (around 12/26/2022), or if symptoms worsen or fail to improve.   Continue healthy lifestyle choices, including diet (rich in fruits, vegetables, and lean proteins, and low in salt and simple carbohydrates) and exercise (at least 30 minutes of moderate physical activity daily).   The above assessment and management plan was discussed with the patient. The patient verbalized understanding of and has agreed to the management plan. Patient is aware to call the clinic if they develop any new symptoms or if  symptoms persist or worsen. Patient is aware when to return to the clinic for a follow-up visit. Patient educated on when it is appropriate to go to the emergency department.   Monia Pouch, FNP-C [facility] [phone]

## 2022-09-26 LAB — CBC WITH DIFFERENTIAL/PLATELET
Basophils Absolute: 0 10*3/uL (ref 0.0–0.2)
Basos: 1 %
EOS (ABSOLUTE): 0.1 10*3/uL (ref 0.0–0.4)
Eos: 1 %
Hematocrit: 41.6 % (ref 37.5–51.0)
Hemoglobin: 14.2 g/dL (ref 13.0–17.7)
Immature Grans (Abs): 0 10*3/uL (ref 0.0–0.1)
Immature Granulocytes: 1 %
Lymphocytes Absolute: 2.1 10*3/uL (ref 0.7–3.1)
Lymphs: 34 %
MCH: 30.9 pg (ref 26.6–33.0)
MCHC: 34.1 g/dL (ref 31.5–35.7)
MCV: 91 fL (ref 79–97)
Monocytes Absolute: 0.6 10*3/uL (ref 0.1–0.9)
Monocytes: 9 %
Neutrophils Absolute: 3.4 10*3/uL (ref 1.4–7.0)
Neutrophils: 54 %
Platelets: 315 10*3/uL (ref 150–450)
RBC: 4.59 x10E6/uL (ref 4.14–5.80)
RDW: 12.9 % (ref 11.6–15.4)
WBC: 6.2 10*3/uL (ref 3.4–10.8)

## 2022-09-26 LAB — ANA COMPREHENSIVE PANEL
Anti JO-1: <0.2 AI (ref 0.0–0.9)
Centromere Ab Screen: <0.2 AI (ref 0.0–0.9)
Chromatin Ab SerPl-aCnc: <0.2 AI (ref 0.0–0.9)
ENA RNP Ab: <0.2 AI (ref 0.0–0.9)
ENA SM Ab Ser-aCnc: <0.2 AI (ref 0.0–0.9)
ENA SSA (RO) Ab: <0.2 AI (ref 0.0–0.9)
ENA SSB (LA) Ab: 1 AI — ABNORMAL HIGH (ref 0.0–0.9)
Scleroderma (Scl-70) (ENA) Antibody, IgG: <0.2 AI (ref 0.0–0.9)
dsDNA Ab: <1 IU/mL (ref 0–9)

## 2022-09-26 LAB — CMP14+EGFR
ALT: 44 IU/L (ref 0–44)
AST: 34 IU/L (ref 0–40)
Albumin/Globulin Ratio: 1.6 (ref 1.2–2.2)
Albumin: 4.2 g/dL (ref 4.1–5.1)
Alkaline Phosphatase: 87 IU/L (ref 44–121)
BUN/Creatinine Ratio: 23 — ABNORMAL HIGH (ref 9–20)
BUN: 21 mg/dL (ref 6–24)
Bilirubin Total: 0.3 mg/dL (ref 0.0–1.2)
CO2: 19 mmol/L — ABNORMAL LOW (ref 20–29)
Calcium: 9 mg/dL (ref 8.7–10.2)
Chloride: 100 mmol/L (ref 96–106)
Creatinine, Ser: 0.92 mg/dL (ref 0.76–1.27)
Globulin, Total: 2.7 g/dL (ref 1.5–4.5)
Glucose: 122 mg/dL — ABNORMAL HIGH (ref 70–99)
Potassium: 4.4 mmol/L (ref 3.5–5.2)
Sodium: 135 mmol/L (ref 134–144)
Total Protein: 6.9 g/dL (ref 6.0–8.5)
eGFR: 101 mL/min/{1.73_m2} (ref >59)

## 2022-09-26 LAB — THYROID PANEL WITH TSH
Free Thyroxine Index: 1.9 (ref 1.2–4.9)
T3 Uptake Ratio: 27 % (ref 24–39)
T4, Total: 7.1 ug/dL (ref 4.5–12.0)
TSH: 1.69 u[IU]/mL (ref 0.450–4.500)

## 2022-09-26 LAB — LIPID PANEL
Chol/HDL Ratio: 4 ratio (ref 0.0–5.0)
Cholesterol, Total: 192 mg/dL (ref 100–199)
HDL: 48 mg/dL (ref >39)
LDL Chol Calc (NIH): 83 mg/dL (ref 0–99)
Triglycerides: 377 mg/dL — ABNORMAL HIGH (ref 0–149)
VLDL Cholesterol Cal: 61 mg/dL — ABNORMAL HIGH (ref 5–40)

## 2022-09-26 LAB — VITAMIN B12: Vitamin B-12: 716 pg/mL (ref 232–1245)

## 2022-09-26 LAB — VITAMIN D 25 HYDROXY (VIT D DEFICIENCY, FRACTURES): Vit D, 25-Hydroxy: 33.7 ng/mL (ref 30.0–100.0)

## 2022-09-26 NOTE — Addendum Note (Signed)
Addended by: Baruch Gouty on: 09/26/2022 02:24 PM   Modules accepted: Orders

## 2022-09-29 LAB — HGB A1C W/O EAG: Hgb A1c MFr Bld: 6 % — ABNORMAL HIGH (ref 4.8–5.6)

## 2022-09-29 LAB — SPECIMEN STATUS REPORT

## 2022-11-25 ENCOUNTER — Encounter: Payer: Self-pay | Admitting: Family Medicine

## 2022-11-25 ENCOUNTER — Ambulatory Visit (INDEPENDENT_AMBULATORY_CARE_PROVIDER_SITE_OTHER): Payer: Commercial Managed Care - PPO | Admitting: Family Medicine

## 2022-11-25 DIAGNOSIS — R202 Paresthesia of skin: Secondary | ICD-10-CM

## 2022-11-25 DIAGNOSIS — G8929 Other chronic pain: Secondary | ICD-10-CM

## 2022-11-25 DIAGNOSIS — I10 Essential (primary) hypertension: Secondary | ICD-10-CM

## 2022-11-25 DIAGNOSIS — E782 Mixed hyperlipidemia: Secondary | ICD-10-CM

## 2022-11-25 DIAGNOSIS — M2550 Pain in unspecified joint: Secondary | ICD-10-CM | POA: Diagnosis not present

## 2022-11-25 DIAGNOSIS — F4323 Adjustment disorder with mixed anxiety and depressed mood: Secondary | ICD-10-CM

## 2022-11-25 DIAGNOSIS — I7300 Raynaud's syndrome without gangrene: Secondary | ICD-10-CM

## 2022-11-25 MED ORDER — AMLODIPINE BESYLATE 10 MG PO TABS: 10.0000 mg | ORAL_TABLET | Freq: Every day | ORAL | 1 refills | Status: DC

## 2022-11-25 MED ORDER — ATORVASTATIN CALCIUM 20 MG PO TABS: 20.0000 mg | ORAL_TABLET | Freq: Every day | ORAL | 1 refills | Status: DC

## 2022-11-25 MED ORDER — LISINOPRIL 40 MG PO TABS: 40.0000 mg | ORAL_TABLET | Freq: Every day | ORAL | 1 refills | Status: DC

## 2022-11-25 MED ORDER — DULOXETINE HCL 60 MG PO CPEP: 60.0000 mg | ORAL_CAPSULE | Freq: Every day | ORAL | 3 refills | Status: AC

## 2022-11-25 MED ORDER — FLUOXETINE HCL 20 MG PO CAPS: 20.0000 mg | ORAL_CAPSULE | Freq: Every day | ORAL | 1 refills | Status: AC

## 2022-11-25 NOTE — Progress Notes (Signed)
Subjective:  Patient ID: Gavin Landry, male    DOB: 1971-02-18, 51 y.o.   MRN: [id number]  Patient Care Team: Baruch Gouty, FNP as PCP - General (Family Medicine)   Chief Complaint:  Pain (Chronic pain of bilateral hands x 2 months. Patient states that his pain has improved )   HPI: Gavin Landry is a 51 y.o. male presenting on 11/25/2022 for Pain (Chronic pain of bilateral hands x 2 months. Patient states that his pain has improved )    1. Essential hypertension Taking medications as prescribed and has not been checking blood pressure at home. Denies chest pain, palpitations, leg swelling, headaches, weakness, or confusion. Does not follow a diet or exercise routine.   2. Situational mixed anxiety and depressive disorder Doing well on current regimen. Denies SI or HI. States he feels much better with the addition of Cymbalta.     09/25/2022    3:30 PM 08/20/2022    3:57 PM 05/20/2022    3:41 PM 02/28/2022    3:55 PM  GAD 7 : Generalized Anxiety Score  Nervous, Anxious, on Edge 0 2 0 3  Control/stop worrying 0 1 0 1  Worry too much - different things 0 0 2 3  Trouble relaxing 2 0 0 2  Restless 0 0 0 1  Easily annoyed or irritable 0 0 0 3  Afraid - awful might happen 0 _0 Total GAD 7 Score _1 Anxiety Difficulty Somewhat difficult Somewhat difficult Very difficult Somewhat difficult       11/25/2022    4:03 PM 09/25/2022    3:30 PM 08/20/2022    3:56 PM 05/20/2022    3:41 PM 02/28/2022    3:54 PM  Depression screen PHQ 2/9  Decreased Interest _2 Down, Depressed, Hopeless 0 _3 PHQ - 2 Score _4 Altered sleeping 0 _5 0  Tired, decreased energy 0 3 3 0 3  Change in appetite 0 0 0 0 0  Feeling bad or failure about yourself  _6 Trouble concentrating 0 2 0 1 0  Moving slowly or fidgety/restless 0 _7 Suicidal thoughts 0 0 0 0 0  PHQ-9 Score _8 Difficult doing work/chores Not difficult at all Somewhat  difficult Somewhat difficult Somewhat difficult Very difficult    3. Chronic pain of multiple joints 4. Paresthesia of both hands Doing much better with the addition of Cymbalta but still has symptoms. Would like to increase dosing.   5. Mixed hyperlipidemia On statin and reports he needs a refill today. Does not follow a diet or exercise routine. No adverse side effects from medications.   6. Raynaud's phenomenon without gangrene Well controlled on CCB therapy and reports he needs a refill today.      Relevant past medical, surgical, family, and social history reviewed and updated as indicated.  Allergies and medications reviewed and updated. Data reviewed: Chart in Epic.   Past Medical History:  Diagnosis Date   Allergy    Anxiety    Arthritis    Depression    Hyperlipidemia    Hypertension    Seizures ([place])     Past Surgical History:  Procedure Laterality Date   APPENDECTOMY     COLONOSCOPY     KNEE SURGERY Left  VASECTOMY      Social History   Socioeconomic History   Marital status: Single    Spouse name: Not on file   Number of children: 4   Years of education: Not on file   Highest education level: Not on file  Occupational History   Occupation: painter  Tobacco Use   Smoking status: Every Day    Packs/day: 0.50    Types: Cigarettes    Start date: 108   Smokeless tobacco: Never  Vaping Use   Vaping Use: Never used  Substance and Sexual Activity   Alcohol use: Yes    Alcohol/week: 7.0 standard drinks of alcohol    Types: 7 Cans of beer per week    Comment: 1-2 beers a day   Drug use: Yes    Types: Marijuana   Sexual activity: Not on file  Other Topics Concern   Not on file  Social History Narrative   Not on file   Social Determinants of Health   Financial Resource Strain: Not on file  Food Insecurity: Not on file  Transportation Needs: Not on file  Physical Activity: Not on file  Stress: Not on file  Social Connections: Not on file   Intimate Partner Violence: Not on file    Outpatient Encounter Medications as of 11/25/2022  Medication Sig   DULoxetine (CYMBALTA) 60 MG capsule Take 1 capsule (60 mg total) by mouth daily.   [DISCONTINUED] amLODipine (NORVASC) 10 MG tablet Take 1 tablet (10 mg total) by mouth daily.   [DISCONTINUED] atorvastatin (LIPITOR) 20 MG tablet Take 1 tablet (20 mg total) by mouth daily.   [DISCONTINUED] DULoxetine (CYMBALTA) 30 MG capsule Take 1 capsule (30 mg total) by mouth daily.   [DISCONTINUED] FLUoxetine (PROZAC) 20 MG capsule Take 1 capsule (20 mg total) by mouth daily.   [DISCONTINUED] lisinopril (ZESTRIL) 40 MG tablet Take 1 tablet (40 mg total) by mouth daily.   amLODipine (NORVASC) 10 MG tablet Take 1 tablet (10 mg total) by mouth daily.   atorvastatin (LIPITOR) 20 MG tablet Take 1 tablet (20 mg total) by mouth daily.   FLUoxetine (PROZAC) 20 MG capsule Take 1 capsule (20 mg total) by mouth daily.   lisinopril (ZESTRIL) 40 MG tablet Take 1 tablet (40 mg total) by mouth daily.   No facility-administered encounter medications on file as of 11/25/2022.    Allergies  Allergen Reactions   Iodine    Other     IV contrast    Review of Systems  Constitutional:  Negative for activity change, appetite change, chills, diaphoresis, fatigue, fever and unexpected weight change.  HENT: Negative.    Eyes: Negative.  Negative for photophobia and visual disturbance.  Respiratory:  Negative for cough, chest tightness and shortness of breath.   Cardiovascular:  Negative for chest pain, palpitations and leg swelling.  Gastrointestinal:  Negative for abdominal pain, blood in stool, constipation, diarrhea, nausea and vomiting.  Endocrine: Negative.   Genitourinary:  Negative for decreased urine volume, difficulty urinating, dysuria, frequency and urgency.  Musculoskeletal:  Positive for arthralgias, back pain and joint swelling. Negative for myalgias.  Skin: Negative.   Allergic/Immunologic:  Negative.   Neurological:  Negative for dizziness, tremors, seizures, syncope, facial asymmetry, speech difficulty, weakness, light-headedness, numbness and headaches.  Hematological: Negative.   Psychiatric/Behavioral:  Positive for dysphoric mood. Negative for agitation, behavioral problems, confusion, decreased concentration, hallucinations, self-injury, sleep disturbance and suicidal ideas. The patient is not nervous/anxious and is not hyperactive.   All other systems  reviewed and are negative.       Objective:  BP (!) 154/93   Pulse (!) 108   Temp 98.2 F (36.8 C) (Temporal)   Ht _0  (1.778 m)   Wt 189 lb 9.6 oz (86 kg)   SpO2 95%   BMI 27.20 kg/m    Wt Readings from Last 3 Encounters:  11/25/22 189 lb 9.6 oz (86 kg)  09/25/22 196 lb 12.8 oz (89.3 kg)  08/20/22 188 lb (85.3 kg)    Physical Exam Vitals and nursing note reviewed.  Constitutional:      General: He is not in acute distress.    Appearance: Normal appearance. He is well-developed and well-groomed. He is not ill-appearing, toxic-appearing or diaphoretic.  HENT:     Head: Normocephalic and atraumatic.     Jaw: There is normal jaw occlusion.     Right Ear: Hearing normal.     Left Ear: Hearing normal.     Nose: Nose normal.     Mouth/Throat:     Lips: Pink.     Mouth: Mucous membranes are moist.     Pharynx: Oropharynx is clear. Uvula midline.  Eyes:     General: Lids are normal.     Pupils: Pupils are equal, round, and reactive to light.  Neck:     Thyroid: No thyroid mass, thyromegaly or thyroid tenderness.     Vascular: No carotid bruit or JVD.     Trachea: Trachea and phonation normal.  Cardiovascular:     Rate and Rhythm: Normal rate and regular rhythm.     Chest Wall: PMI is not displaced.     Pulses: Normal pulses.     Heart sounds: Normal heart sounds. No murmur heard.    No friction rub. No gallop.  Pulmonary:     Effort: Pulmonary effort is normal. No respiratory distress.     Breath  sounds: Normal breath sounds. No wheezing.  Abdominal:     General: There is no abdominal bruit.     Palpations: There is no hepatomegaly or splenomegaly.  Musculoskeletal:     Cervical back: Normal range of motion and neck supple.  Lymphadenopathy:     Cervical: No cervical adenopathy.  Skin:    General: Skin is warm and dry.     Capillary Refill: Capillary refill takes less than 2 seconds.     Coloration: Skin is not cyanotic, jaundiced or pale.     Findings: No rash.  Neurological:     General: No focal deficit present.     Mental Status: He is alert and oriented to person, place, and time.     Sensory: Sensation is intact.     Motor: Motor function is intact.     Coordination: Coordination is intact.     Gait: Gait is intact.     Deep Tendon Reflexes: Reflexes are normal and symmetric.  Psychiatric:        Attention and Perception: Attention and perception normal.        Mood and Affect: Mood and affect normal.        Speech: Speech normal.        Behavior: Behavior normal. Behavior is cooperative.        Thought Content: Thought content normal.        Cognition and Memory: Cognition and memory normal.        Judgment: Judgment normal.     Results for orders placed or performed in visit on 09/25/22  ANA  Comprehensive Panel  Result Value Ref Range   dsDNA Ab <1 0 - 9 IU/mL   ENA RNP Ab <0.2 0.0 - 0.9 AI   ENA SM Ab Ser-aCnc <0.2 0.0 - 0.9 AI   Scleroderma (Scl-70) (ENA) Antibody, IgG <0.2 0.0 - 0.9 AI   ENA SSA (RO) Ab <0.2 0.0 - 0.9 AI   ENA SSB (LA) Ab 1.0 (H) 0.0 - 0.9 AI   Chromatin Ab SerPl-aCnc <0.2 0.0 - 0.9 AI   Anti JO-1 <0.2 0.0 - 0.9 AI   Centromere Ab Screen <0.2 0.0 - 0.9 AI   See below: Comment   CBC with Differential/Platelet  Result Value Ref Range   WBC 6.2 3.4 - 10.8 x10E3/uL   RBC 4.59 4.14 - 5.80 x10E6/uL   Hemoglobin 14.2 13.0 - 17.7 g/dL   Hematocrit 41.6 37.5 - 51.0 %   MCV 91 79 - 97 fL   MCH 30.9 26.6 - 33.0 pg   MCHC 34.1 31.5 - 35.7  g/dL   RDW 12.9 11.6 - 15.4 %   Platelets 315 150 - 450 x10E3/uL   Neutrophils 54 Not Estab. %   Lymphs 34 Not Estab. %   Monocytes 9 Not Estab. %   Eos 1 Not Estab. %   Basos 1 Not Estab. %   Neutrophils Absolute 3.4 1.4 - 7.0 x10E3/uL   Lymphocytes Absolute 2.1 0.7 - 3.1 x10E3/uL   Monocytes Absolute 0.6 0.1 - 0.9 x10E3/uL   EOS (ABSOLUTE) 0.1 0.0 - 0.4 x10E3/uL   Basophils Absolute 0.0 0.0 - 0.2 x10E3/uL   Immature Granulocytes 1 Not Estab. %   Immature Grans (Abs) 0.0 0.0 - 0.1 x10E3/uL  CMP14+EGFR  Result Value Ref Range   Glucose 122 (H) 70 - 99 mg/dL   BUN 21 6 - 24 mg/dL   Creatinine, Ser 0.92 0.76 - 1.27 mg/dL   eGFR 101 >59 mL/min/1.73   BUN/Creatinine Ratio 23 (H) 9 - 20   Sodium 135 134 - 144 mmol/L   Potassium 4.4 3.5 - 5.2 mmol/L   Chloride 100 96 - 106 mmol/L   CO2 19 (L) 20 - 29 mmol/L   Calcium 9.0 8.7 - 10.2 mg/dL   Total Protein 6.9 6.0 - 8.5 g/dL   Albumin 4.2 4.1 - 5.1 g/dL   Globulin, Total 2.7 1.5 - 4.5 g/dL   Albumin/Globulin Ratio 1.6 1.2 - 2.2   Bilirubin Total 0.3 0.0 - 1.2 mg/dL   Alkaline Phosphatase 87 44 - 121 IU/L   AST 34 0 - 40 IU/L   ALT 44 0 - 44 IU/L  Thyroid Panel With TSH  Result Value Ref Range   TSH 1.690 0.450 - 4.500 uIU/mL   T4, Total 7.1 4.5 - 12.0 ug/dL   T3 Uptake Ratio 27 24 - 39 %   Free Thyroxine Index 1.9 1.2 - 4.9  Lipid panel  Result Value Ref Range   Cholesterol, Total 192 100 - 199 mg/dL   Triglycerides 377 (H) 0 - 149 mg/dL   HDL 48 >39 mg/dL   VLDL Cholesterol Cal 61 (H) 5 - 40 mg/dL   LDL Chol Calc (NIH) 83 0 - 99 mg/dL   Chol/HDL Ratio 4.0 0.0 - 5.0 ratio  VITAMIN D 25 Hydroxy (Vit-D Deficiency, Fractures)  Result Value Ref Range   Vit D, 25-Hydroxy 33.7 30.0 - 100.0 ng/mL  Vitamin B12  Result Value Ref Range   Vitamin B-12 716 232 - 1,245 pg/mL  Hgb A1c w/o eAG  Result  Value Ref Range   Hgb A1c MFr Bld 6.0 (H) 4.8 - 5.6 %  Specimen status report  Result Value Ref Range   specimen status report  Comment        Pertinent labs & imaging results that were available during my care of the patient were reviewed by me and considered in my medical decision making.  Assessment & Plan:  Dierre was seen today for pain.  Diagnoses and all orders for this visit:  Essential hypertension BP fairly controlled. Changes were not made in regimen today. Goal BP is 130/80. Pt aware to report any persistent high or low readings. DASH diet and exercise encouraged. Exercise at least 150 minutes per week and increase as tolerated. Goal BMI > 25. Stress management encouraged. Avoid nicotine and tobacco product use. Avoid excessive alcohol and NSAID's. Avoid more than 2000 mg of sodium daily. Medications as prescribed. Follow up as scheduled.  -     lisinopril (ZESTRIL) 40 MG tablet; Take 1 tablet (40 mg total) by mouth daily. -     amLODipine (NORVASC) 10 MG tablet; Take 1 tablet (10 mg total) by mouth daily.  Situational mixed anxiety and depressive disorder Doing much better since last visit. No SI or HI. Dosing of Cymbalta increased. May be able to stop fluoxetine at next visit.  -     FLUoxetine (PROZAC) 20 MG capsule; Take 1 capsule (20 mg total) by mouth daily. -     DULoxetine (CYMBALTA) 60 MG capsule; Take 1 capsule (60 mg total) by mouth daily.  Chronic pain of multiple joints Doing better but still symptomatic, will increase dosing to 60 mg daily to see if beneficial. May be able to stop fluoxetine at follow up.  -     DULoxetine (CYMBALTA) 60 MG capsule; Take 1 capsule (60 mg total) by mouth daily.  Mixed hyperlipidemia Diet encouraged - increase intake of fresh fruits and vegetables, increase intake of lean proteins. Bake, broil, or grill foods. Avoid fried, greasy, and fatty foods. Avoid fast foods. Increase intake of fiber-rich whole grains. Exercise encouraged - at least 150 minutes per week and advance as tolerated.  Goal BMI < 25. Continue medications as prescribed. Follow up in 3-6 months  as discussed.  -     atorvastatin (LIPITOR) 20 MG tablet; Take 1 tablet (20 mg total) by mouth daily.  Raynaud's phenomenon without gangrene Doing well on below, will continue.  -     amLODipine (NORVASC) 10 MG tablet; Take 1 tablet (10 mg total) by mouth daily.     Continue all other maintenance medications.  Follow up plan: Return in about 6 months (around 05/27/2023), or if symptoms worsen or fail to improve, for chronic follow up.   Continue healthy lifestyle choices, including diet (rich in fruits, vegetables, and lean proteins, and low in salt and simple carbohydrates) and exercise (at least 30 minutes of moderate physical activity daily).   The above assessment and management plan was discussed with the patient. The patient verbalized understanding of and has agreed to the management plan. Patient is aware to call the clinic if they develop any new symptoms or if symptoms persist or worsen. Patient is aware when to return to the clinic for a follow-up visit. Patient educated on when it is appropriate to go to the emergency department.   Monia Pouch, FNP-C [facility] [phone]

## 2023-02-12 ENCOUNTER — Encounter: Payer: Self-pay | Admitting: Radiology

## 2023-05-16 ENCOUNTER — Other Ambulatory Visit: Payer: Self-pay | Admitting: Family Medicine

## 2023-05-16 DIAGNOSIS — G8929 Other chronic pain: Secondary | ICD-10-CM

## 2023-05-16 DIAGNOSIS — R202 Paresthesia of skin: Secondary | ICD-10-CM

## 2023-05-27 ENCOUNTER — Ambulatory Visit: Payer: Commercial Managed Care - PPO | Admitting: Family Medicine

## 2023-05-27 ENCOUNTER — Encounter: Payer: Self-pay | Admitting: Family Medicine

## 2023-06-20 ENCOUNTER — Other Ambulatory Visit: Payer: Self-pay | Admitting: Family Medicine

## 2023-06-20 DIAGNOSIS — E782 Mixed hyperlipidemia: Secondary | ICD-10-CM

## 2023-06-20 DIAGNOSIS — I10 Essential (primary) hypertension: Secondary | ICD-10-CM

## 2023-09-06 ENCOUNTER — Other Ambulatory Visit: Payer: Self-pay | Admitting: Family Medicine

## 2023-09-06 DIAGNOSIS — I10 Essential (primary) hypertension: Secondary | ICD-10-CM

## 2023-09-07 ENCOUNTER — Encounter: Payer: Self-pay | Admitting: Family Medicine

## 2023-09-07 NOTE — Telephone Encounter (Signed)
Rakes pt NTBS 30-d given 07/28/23

## 2023-09-07 NOTE — Telephone Encounter (Signed)
Attempted to call, phone number on file is out of service. Letter sent.

## 2023-10-11 ENCOUNTER — Other Ambulatory Visit: Payer: Self-pay | Admitting: Family Medicine

## 2023-10-11 DIAGNOSIS — I10 Essential (primary) hypertension: Secondary | ICD-10-CM

## 2023-10-11 DIAGNOSIS — E782 Mixed hyperlipidemia: Secondary | ICD-10-CM

## 2023-10-11 DIAGNOSIS — I7300 Raynaud's syndrome without gangrene: Secondary | ICD-10-CM
